# Patient Record
Sex: Male | Born: 1990 | Race: White | Hispanic: No | Marital: Married | Smoking: Never smoker
Health system: Southern US, Community
[De-identification: ages and names within clinical notes are randomized; demographics above are authoritative.]

## PROBLEM LIST (undated history)

## (undated) DIAGNOSIS — M549 Dorsalgia, unspecified: Secondary | ICD-10-CM

## (undated) DIAGNOSIS — K589 Irritable bowel syndrome without diarrhea: Secondary | ICD-10-CM

## (undated) DIAGNOSIS — F32A Depression, unspecified: Secondary | ICD-10-CM

## (undated) DIAGNOSIS — R12 Heartburn: Secondary | ICD-10-CM

## (undated) DIAGNOSIS — E7800 Pure hypercholesterolemia, unspecified: Secondary | ICD-10-CM

## (undated) DIAGNOSIS — R0602 Shortness of breath: Secondary | ICD-10-CM

## (undated) DIAGNOSIS — F329 Major depressive disorder, single episode, unspecified: Secondary | ICD-10-CM

## (undated) DIAGNOSIS — F419 Anxiety disorder, unspecified: Secondary | ICD-10-CM

## (undated) DIAGNOSIS — R079 Chest pain, unspecified: Secondary | ICD-10-CM

## (undated) HISTORY — DX: Shortness of breath: R06.02

## (undated) HISTORY — DX: Chest pain, unspecified: R07.9

## (undated) HISTORY — DX: Dorsalgia, unspecified: M54.9

## (undated) HISTORY — DX: Pure hypercholesterolemia, unspecified: E78.00

## (undated) HISTORY — DX: Heartburn: R12

## (undated) HISTORY — DX: Irritable bowel syndrome, unspecified: K58.9

---

## 2013-03-21 ENCOUNTER — Emergency Department (HOSPITAL_COMMUNITY)
Admission: EM | Admit: 2013-03-21 | Discharge: 2013-03-22 | Disposition: A | Discharge status: Home / Self Care | Payer: BC Managed Care – PPO | Attending: Emergency Medicine | Admitting: Emergency Medicine

## 2013-03-21 ENCOUNTER — Encounter (HOSPITAL_COMMUNITY): Payer: Self-pay

## 2013-03-21 ENCOUNTER — Emergency Department (HOSPITAL_COMMUNITY): Payer: BC Managed Care – PPO

## 2013-03-21 DIAGNOSIS — F419 Anxiety disorder, unspecified: Secondary | ICD-10-CM

## 2013-03-21 DIAGNOSIS — F410 Panic disorder [episodic paroxysmal anxiety] without agoraphobia: Secondary | ICD-10-CM | POA: Insufficient documentation

## 2013-03-21 DIAGNOSIS — R002 Palpitations: Secondary | ICD-10-CM | POA: Insufficient documentation

## 2013-03-21 DIAGNOSIS — F411 Generalized anxiety disorder: Secondary | ICD-10-CM | POA: Insufficient documentation

## 2013-03-21 LAB — URINALYSIS, ROUTINE W REFLEX MICROSCOPIC
Bilirubin Urine: NEGATIVE
Glucose, UA: NEGATIVE mg/dL
Ketones, ur: NEGATIVE mg/dL
Leukocytes, UA: NEGATIVE
Nitrite: NEGATIVE
Specific Gravity, Urine: 1.018 (ref 1.005–1.030)
pH: 6 (ref 5.0–8.0)

## 2013-03-21 LAB — CBC WITH DIFFERENTIAL/PLATELET
Eosinophils Absolute: 0 10*3/uL (ref 0.0–0.7)
Eosinophils Relative: 0 % (ref 0–5)
HCT: 43.8 % (ref 39.0–52.0)
Hemoglobin: 15.7 g/dL (ref 13.0–17.0)
Lymphocytes Relative: 19 % (ref 12–46)
Lymphs Abs: 1.6 10*3/uL (ref 0.7–4.0)
MCH: 28.9 pg (ref 26.0–34.0)
MCV: 80.5 fL (ref 78.0–100.0)
Monocytes Relative: 7 % (ref 3–12)
RBC: 5.44 MIL/uL (ref 4.22–5.81)
WBC: 8.4 10*3/uL (ref 4.0–10.5)

## 2013-03-21 LAB — BASIC METABOLIC PANEL
BUN: 10 mg/dL (ref 6–23)
CO2: 26 mEq/L (ref 19–32)
Chloride: 100 mEq/L (ref 96–112)
Creatinine, Ser: 0.85 mg/dL (ref 0.50–1.35)
Potassium: 3.4 mEq/L — ABNORMAL LOW (ref 3.5–5.1)

## 2013-03-21 MED ORDER — LORAZEPAM 1 MG PO TABS
1.0000 mg | ORAL_TABLET | Freq: Once | ORAL | Status: AC
  Administered 2013-03-21: 1 mg via ORAL
  Filled 2013-03-21: qty 1

## 2013-03-21 MED ORDER — LORAZEPAM 1 MG PO TABS: 1.0000 mg | ORAL_TABLET | Freq: Three times a day (TID) | ORAL | Status: AC | PRN

## 2013-03-21 NOTE — ED Provider Notes (Signed)
History     CSN: [id number]  Arrival date & time 03/21/13  1756   First MD Initiated Contact with Patient 03/21/13 2108      Chief Complaint  Patient presents with  . Abdominal Pain    (Consider location/radiation/quality/duration/timing/severity/associated sxs/prior treatment) HPI Comments: 22 year old male with no significant past medical history presents to the emergency department complaining of left-sided chest pain with associated palpitations lasting about an hour and a half earlier today. States he has a similar episode 6 days ago with left-sided chest pain associated palpitations lasting only 2 minutes, at the same time he felt lightheaded and dizzy. He reports feeling lightheaded and dizzy as well today. Pain described as a throbbing sensation, 6/10, radiating to his back, worse with deep inspiration causing him to become short of breath. Denies any new activity out of is normal. He is a nonsmoker, no recent long car or plane rides. He went to the health center at his school earlier today due to this pain and was told he was dehydrated, had vertigo and was given meclizine. States he drinks water throughout the day every day and sits at a desk all day and does not believe he is dehydrated. Meclizine is not making any difference for dizziness as this is not a constant feeling, only with the heart palpitations. Currently asymptomatic. Also states he has been urinating more frequently than normal and is concerned of diabetes as it runs in his family. Despite triage summary, patient denies any abdominal pain or epigastric discomfort and did not make any mention of food in triage.  Patient is a 22 y.o. male presenting with abdominal pain. The history is provided by the patient.  Abdominal Pain Associated symptoms include chest pain. Pertinent negatives include no abdominal pain, chills, fatigue, fever, headaches, nausea or vomiting.    History reviewed. No pertinent past medical  history.  History reviewed. No pertinent past surgical history.  No family history on file.  History  Substance Use Topics  . Smoking status: Not on file  . Smokeless tobacco: Not on file  . Alcohol Use: Not on file      Review of Systems  Constitutional: Negative for fever, chills and fatigue.  Eyes: Negative for visual disturbance.  Respiratory: Positive for shortness of breath. Negative for wheezing.   Cardiovascular: Positive for chest pain and palpitations. Negative for leg swelling.  Gastrointestinal: Negative for nausea, vomiting and abdominal pain.  Endocrine: Positive for polyuria.  Genitourinary: Negative for dysuria, urgency, enuresis and difficulty urinating.  Musculoskeletal: Positive for back pain.  Neurological: Positive for dizziness. Negative for headaches.  All other systems reviewed and are negative.    Allergies  Review of patient's allergies indicates no known allergies.  Home Medications   Current Outpatient Rx  Name  Route  Sig  Dispense  Refill  . ibuprofen (ADVIL,MOTRIN) 200 MG tablet   Oral   Take 200 mg by mouth every 6 (six) hours as needed for pain.           BP 131/88  Pulse 105  Temp(Src) 98 F (36.7 C) (Oral)  SpO2 99%  Physical Exam  Nursing note and vitals reviewed. Constitutional: He is oriented to person, place, and time. He appears well-developed and well-nourished. No distress.  HENT:  Head: Normocephalic and atraumatic.  Mouth/Throat: Oropharynx is clear and moist.  Eyes: Conjunctivae and EOM are normal. Pupils are equal, round, and reactive to light.  Neck: Normal range of motion. Neck supple.  Cardiovascular: Regular rhythm,  normal heart sounds and intact distal pulses.  Tachycardia present.   Pulmonary/Chest: Effort normal and breath sounds normal. No respiratory distress. He has no wheezes. He has no rales. He exhibits no tenderness.  Abdominal: Soft. Bowel sounds are normal. There is no tenderness.   Musculoskeletal: Normal range of motion. He exhibits no edema.  Neurological: He is alert and oriented to person, place, and time. No cranial nerve deficit. Coordination normal.  Skin: Skin is warm and dry. He is not diaphoretic.  Psychiatric: His behavior is normal. His mood appears anxious.    ED Course  Procedures (including critical care time)  Labs Reviewed  GLUCOSE, CAPILLARY - Abnormal; Notable for the following:    Glucose-Capillary 128 (*)    All other components within normal limits  BASIC METABOLIC PANEL - Abnormal; Notable for the following:    Potassium 3.4 (*)    Glucose, Bld 128 (*)    All other components within normal limits  CBC WITH DIFFERENTIAL  D-DIMER, QUANTITATIVE  URINALYSIS, ROUTINE W REFLEX MICROSCOPIC   Dg Chest 2 View  03/21/2013   *RADIOLOGY REPORT*  Clinical Data:  Chest pain and syncope.  CHEST - 2 VIEW  Comparison: None  Findings: The heart size and mediastinal contours are within normal limits.  Both lungs are clear.  The visualized skeletal structures are unremarkable.  IMPRESSION: No active disease.   Original Report Authenticated By: Irish Lack, M.D.     1. Anxiety   2. Panic attack       MDM  22 y/o male with chest pain and palpitations. Mild tachycardia on initial exam. Chest pain pleuritic, obtained d-dimer to r/o PE which was normal. After discussing results with patient, he admits to being under increased stress and has been very anxious with work. He also asked me to speak with his mom on the phone which I did who states he has bad anxiety and overworks himself. Patient states he used to see a therapist. I feel his palpitations and chest pain are from anxiety. Prescription for ativan given. Tachycardia subsided on repeat examination after ativan given in ED. Advised to see therapist, resource guide given for PCP follow up. Glucose 128.        Trevor Mace, PA-C 03/21/13 2327

## 2013-03-21 NOTE — ED Notes (Signed)
Pt states that he had been seen by his md office at by his school and was having abd pain worse after food and lightheaded, was told that he was dehydrated and had vertigo and was given meclazine. Has had pain to epigastric area.

## 2013-03-23 NOTE — ED Provider Notes (Signed)
Medical screening examination/treatment/procedure(s) were performed by non-physician practitioner and as supervising physician I was immediately available for consultation/collaboration.   Richardean Canal, MD 03/23/13 [phone]

## 2015-03-08 ENCOUNTER — Encounter
Admission: EM | Disposition: A | Discharge status: Home / Self Care | Payer: Self-pay | Source: Home / Self Care | Attending: Emergency Medicine

## 2015-03-08 ENCOUNTER — Observation Stay
Admission: EM | Admit: 2015-03-08 | Discharge: 2015-03-10 | Disposition: A | Discharge status: Home / Self Care | Payer: BLUE CROSS/BLUE SHIELD | Attending: Surgery | Admitting: Surgery

## 2015-03-08 ENCOUNTER — Emergency Department: Payer: BLUE CROSS/BLUE SHIELD

## 2015-03-08 ENCOUNTER — Observation Stay: Payer: BLUE CROSS/BLUE SHIELD | Admitting: Anesthesiology

## 2015-03-08 ENCOUNTER — Encounter: Payer: Self-pay | Admitting: Emergency Medicine

## 2015-03-08 DIAGNOSIS — K3580 Unspecified acute appendicitis: Principal | ICD-10-CM | POA: Diagnosis present

## 2015-03-08 DIAGNOSIS — F419 Anxiety disorder, unspecified: Secondary | ICD-10-CM | POA: Diagnosis not present

## 2015-03-08 DIAGNOSIS — K353 Acute appendicitis with localized peritonitis, without perforation or gangrene: Secondary | ICD-10-CM

## 2015-03-08 DIAGNOSIS — F329 Major depressive disorder, single episode, unspecified: Secondary | ICD-10-CM | POA: Insufficient documentation

## 2015-03-08 HISTORY — DX: Anxiety disorder, unspecified: F41.9

## 2015-03-08 HISTORY — DX: Major depressive disorder, single episode, unspecified: F32.9

## 2015-03-08 HISTORY — DX: Depression, unspecified: F32.A

## 2015-03-08 HISTORY — PX: LAPAROSCOPIC APPENDECTOMY: SHX408

## 2015-03-08 LAB — CBC WITH DIFFERENTIAL/PLATELET
Basophils Absolute: 0.1 10*3/uL (ref 0–0.1)
Basophils Relative: 0 %
EOS PCT: 0 %
Eosinophils Absolute: 0 10*3/uL (ref 0–0.7)
HCT: 46 % (ref 40.0–52.0)
Hemoglobin: 15.3 g/dL (ref 13.0–18.0)
LYMPHS ABS: 2.4 10*3/uL (ref 1.0–3.6)
LYMPHS PCT: 16 %
MCH: 27.9 pg (ref 26.0–34.0)
MCHC: 33.3 g/dL (ref 32.0–36.0)
MCV: 84 fL (ref 80.0–100.0)
MONOS PCT: 7 %
Monocytes Absolute: 1 10*3/uL (ref 0.2–1.0)
NEUTROS PCT: 77 %
Neutro Abs: 11.5 10*3/uL — ABNORMAL HIGH (ref 1.4–6.5)
PLATELETS: 241 10*3/uL (ref 150–440)
RBC: 5.47 MIL/uL (ref 4.40–5.90)
RDW: 12.5 % (ref 11.5–14.5)
WBC: 15 10*3/uL — ABNORMAL HIGH (ref 3.8–10.6)

## 2015-03-08 LAB — COMPREHENSIVE METABOLIC PANEL
ALBUMIN: 5 g/dL (ref 3.5–5.0)
ALT: 22 U/L (ref 17–63)
ANION GAP: 7 (ref 5–15)
AST: 22 U/L (ref 15–41)
Alkaline Phosphatase: 58 U/L (ref 38–126)
BILIRUBIN TOTAL: 1 mg/dL (ref 0.3–1.2)
BUN: 16 mg/dL (ref 6–20)
CHLORIDE: 102 mmol/L (ref 101–111)
CO2: 32 mmol/L (ref 22–32)
Calcium: 10.1 mg/dL (ref 8.9–10.3)
Creatinine, Ser: 0.96 mg/dL (ref 0.61–1.24)
GFR calc Af Amer: >60 mL/min (ref >60)
GFR calc non Af Amer: >60 mL/min (ref >60)
Glucose, Bld: 104 mg/dL — ABNORMAL HIGH (ref 65–99)
POTASSIUM: 4 mmol/L (ref 3.5–5.1)
SODIUM: 141 mmol/L (ref 135–145)
TOTAL PROTEIN: 8.4 g/dL — AB (ref 6.5–8.1)

## 2015-03-08 LAB — LIPASE, BLOOD: Lipase: 37 U/L (ref 22–51)

## 2015-03-08 LAB — URINALYSIS COMPLETE WITH MICROSCOPIC (ARMC ONLY)
BACTERIA UA: NONE SEEN
BILIRUBIN URINE: NEGATIVE
GLUCOSE, UA: NEGATIVE mg/dL
Hgb urine dipstick: NEGATIVE
Ketones, ur: NEGATIVE mg/dL
Leukocytes, UA: NEGATIVE
Nitrite: NEGATIVE
PH: 6 (ref 5.0–8.0)
Protein, ur: NEGATIVE mg/dL
Specific Gravity, Urine: 1.018 (ref 1.005–1.030)
Squamous Epithelial / LPF: NONE SEEN

## 2015-03-08 SURGERY — APPENDECTOMY, LAPAROSCOPIC
Anesthesia: General | Site: Abdomen | Wound class: Clean Contaminated

## 2015-03-08 MED ORDER — HYDROMORPHONE HCL 1 MG/ML IJ SOLN: 0.2500 mg | INTRAMUSCULAR | Status: DC | PRN

## 2015-03-08 MED ORDER — IOHEXOL 300 MG/ML  SOLN
100.0000 mL | Freq: Once | INTRAMUSCULAR | Status: AC | PRN
  Administered 2015-03-08: 100 mL via INTRAVENOUS

## 2015-03-08 MED ORDER — MIDAZOLAM HCL 2 MG/2ML IJ SOLN
INTRAMUSCULAR | Status: DC | PRN
  Administered 2015-03-08: 2 mg via INTRAVENOUS

## 2015-03-08 MED ORDER — MORPHINE SULFATE 4 MG/ML IJ SOLN
INTRAMUSCULAR | Status: AC
  Administered 2015-03-08: 4 mg via INTRAVENOUS
  Filled 2015-03-08: qty 1

## 2015-03-08 MED ORDER — ENOXAPARIN SODIUM 40 MG/0.4ML ~~LOC~~ SOLN
40.0000 mg | SUBCUTANEOUS | Status: DC
  Administered 2015-03-09 – 2015-03-10 (×2): 40 mg via SUBCUTANEOUS
  Filled 2015-03-08 (×2): qty 0.4

## 2015-03-08 MED ORDER — SUCCINYLCHOLINE CHLORIDE 20 MG/ML IJ SOLN
INTRAMUSCULAR | Status: DC | PRN
  Administered 2015-03-08: 100 mg via INTRAVENOUS

## 2015-03-08 MED ORDER — ROCURONIUM BROMIDE 100 MG/10ML IV SOLN
INTRAVENOUS | Status: DC | PRN
  Administered 2015-03-08: 40 mg via INTRAVENOUS

## 2015-03-08 MED ORDER — ONDANSETRON HCL 4 MG/2ML IJ SOLN: 4.0000 mg | Freq: Once | INTRAMUSCULAR | Status: DC | PRN

## 2015-03-08 MED ORDER — KCL IN DEXTROSE-NACL 20-5-0.45 MEQ/L-%-% IV SOLN
INTRAVENOUS | Status: DC
  Administered 2015-03-09 – 2015-03-10 (×4): via INTRAVENOUS
  Filled 2015-03-08 (×7): qty 1000

## 2015-03-08 MED ORDER — MORPHINE SULFATE 2 MG/ML IJ SOLN
2.0000 mg | INTRAMUSCULAR | Status: DC | PRN
  Administered 2015-03-09: 2 mg via INTRAVENOUS
  Administered 2015-03-09 (×2): 4 mg via INTRAVENOUS
  Filled 2015-03-08 (×2): qty 2
  Filled 2015-03-08: qty 1

## 2015-03-08 MED ORDER — FENTANYL CITRATE (PF) 100 MCG/2ML IJ SOLN
25.0000 ug | INTRAMUSCULAR | Status: AC | PRN
  Administered 2015-03-08 (×6): 25 ug via INTRAVENOUS

## 2015-03-08 MED ORDER — ONDANSETRON HCL 4 MG/2ML IJ SOLN: 4.0000 mg | Freq: Once | INTRAMUSCULAR | Status: AC

## 2015-03-08 MED ORDER — KETOROLAC TROMETHAMINE 30 MG/ML IJ SOLN
INTRAMUSCULAR | Status: DC | PRN
  Administered 2015-03-08: 30 mg via INTRAVENOUS

## 2015-03-08 MED ORDER — SUGAMMADEX SODIUM 200 MG/2ML IV SOLN
INTRAVENOUS | Status: DC | PRN
  Administered 2015-03-08: 250 mg via INTRAVENOUS

## 2015-03-08 MED ORDER — HYDROCODONE-ACETAMINOPHEN 5-325 MG PO TABS
1.0000 | ORAL_TABLET | ORAL | Status: DC | PRN
  Administered 2015-03-09 (×3): 2 via ORAL
  Administered 2015-03-10: 1 via ORAL
  Administered 2015-03-10: 2 via ORAL
  Administered 2015-03-10: 1 via ORAL
  Filled 2015-03-08: qty 1
  Filled 2015-03-08 (×4): qty 2
  Filled 2015-03-08: qty 1

## 2015-03-08 MED ORDER — ONDANSETRON HCL 4 MG/2ML IJ SOLN
INTRAMUSCULAR | Status: AC
  Administered 2015-03-08: 4 mg via INTRAVENOUS
  Filled 2015-03-08: qty 2

## 2015-03-08 MED ORDER — IOHEXOL 240 MG/ML SOLN: 25.0000 mL | Freq: Once | INTRAMUSCULAR | Status: DC | PRN

## 2015-03-08 MED ORDER — SODIUM CHLORIDE 0.9 % IR SOLN
Status: DC | PRN
  Administered 2015-03-08: 100 mL

## 2015-03-08 MED ORDER — SODIUM CHLORIDE 0.9 % IV SOLN
1.5000 g | Freq: Four times a day (QID) | INTRAVENOUS | Status: AC
  Administered 2015-03-09 (×2): 1.5 g via INTRAVENOUS
  Filled 2015-03-08 (×3): qty 1.5

## 2015-03-08 MED ORDER — FENTANYL CITRATE (PF) 100 MCG/2ML IJ SOLN
INTRAMUSCULAR | Status: AC
  Filled 2015-03-08: qty 2

## 2015-03-08 MED ORDER — MORPHINE SULFATE 4 MG/ML IJ SOLN: 4.0000 mg | Freq: Once | INTRAMUSCULAR | Status: AC

## 2015-03-08 MED ORDER — LACTATED RINGERS IV SOLN
INTRAVENOUS | Status: DC | PRN
  Administered 2015-03-08: 21:00:00 via INTRAVENOUS

## 2015-03-08 MED ORDER — PROPOFOL 10 MG/ML IV BOLUS
INTRAVENOUS | Status: DC | PRN
  Administered 2015-03-08: 180 mg via INTRAVENOUS

## 2015-03-08 MED ORDER — SERTRALINE HCL 50 MG PO TABS
100.0000 mg | ORAL_TABLET | Freq: Every evening | ORAL | Status: DC
  Administered 2015-03-09: 100 mg via ORAL
  Filled 2015-03-08 (×2): qty 2

## 2015-03-08 MED ORDER — ONDANSETRON HCL 4 MG/2ML IJ SOLN
INTRAMUSCULAR | Status: DC | PRN
  Administered 2015-03-08: 4 mg via INTRAVENOUS

## 2015-03-08 MED ORDER — CEFOXITIN SODIUM-DEXTROSE 1-4 GM-% IV SOLR (PREMIX)
1.0000 g | Freq: Four times a day (QID) | INTRAVENOUS | Status: DC
  Administered 2015-03-08: 1 g via INTRAVENOUS
  Filled 2015-03-08 (×3): qty 50

## 2015-03-08 MED ORDER — FENTANYL CITRATE (PF) 100 MCG/2ML IJ SOLN
INTRAMUSCULAR | Status: DC | PRN
  Administered 2015-03-08: 100 ug via INTRAVENOUS
  Administered 2015-03-08 (×2): 50 ug via INTRAVENOUS

## 2015-03-08 SURGICAL SUPPLY — 33 items
APPLIER CLIP ROT 10 11.4 M/L (STAPLE) ×2
CANISTER SUCT 1200ML W/VALVE (MISCELLANEOUS) ×2 IMPLANT
CATH TRAY 16F METER LATEX (MISCELLANEOUS) ×2 IMPLANT
CHLORAPREP W/TINT 26ML (MISCELLANEOUS) ×2 IMPLANT
CLIP APPLIE ROT 10 11.4 M/L (STAPLE) ×1 IMPLANT
CUTTER FLEX LINEAR 45M (STAPLE) ×2 IMPLANT
ELECT CAUTERY BLADE TIP 2.5 (TIP) ×2
ELECTRODE CAUTERY BLDE TIP 2.5 (TIP) ×1 IMPLANT
ENDOPOUCH RETRIEVER 10 (MISCELLANEOUS) ×2 IMPLANT
GLOVE BIO SURGEON STRL SZ7.5 (GLOVE) ×2 IMPLANT
GOWN STRL REUS W/ TWL LRG LVL3 (GOWN DISPOSABLE) ×2 IMPLANT
GOWN STRL REUS W/TWL LRG LVL3 (GOWN DISPOSABLE) ×2
IRRIGATION STRYKERFLOW (MISCELLANEOUS) ×1 IMPLANT
IRRIGATOR STRYKERFLOW (MISCELLANEOUS) ×2
IV NS 1000ML (IV SOLUTION) ×1
IV NS 1000ML BAXH (IV SOLUTION) ×1 IMPLANT
KIT RM TURNOVER STRD PROC AR (KITS) ×2 IMPLANT
NS IRRIG 500ML POUR BTL (IV SOLUTION) ×2 IMPLANT
PACK LAP CHOLECYSTECTOMY (MISCELLANEOUS) ×2 IMPLANT
PAD GROUND ADULT SPLIT (MISCELLANEOUS) ×2 IMPLANT
PENCIL ELECTRO HAND CTR (MISCELLANEOUS) ×2 IMPLANT
RELOAD STAPLE TA45 3.5 REG BLU (ENDOMECHANICALS) ×4 IMPLANT
SCISSORS METZENBAUM CVD 33 (INSTRUMENTS) IMPLANT
SHEARS HARMONIC ACE PLUS 36CM (ENDOMECHANICALS) ×2 IMPLANT
SLEEVE ENDOPATH XCEL 5M (ENDOMECHANICALS) ×2 IMPLANT
STRIP SUTURE WOUND CLOSURE 1/2 (SUTURE) ×2 IMPLANT
SUT MON AB 5-0 P3 18 (SUTURE) ×2 IMPLANT
SUT PDS PLUS 0 (SUTURE) ×1
SUT PDS PLUS AB 0 CT-2 (SUTURE) ×1 IMPLANT
SUT VICRYL 0 AB UR-6 (SUTURE) ×4 IMPLANT
TROCAR XCEL BLUNT TIP 100MML (ENDOMECHANICALS) ×2 IMPLANT
TROCAR XCEL NON-BLD 5MMX100MML (ENDOMECHANICALS) ×2 IMPLANT
TUBING INSUFFLATOR HI FLOW (MISCELLANEOUS) ×2 IMPLANT

## 2015-03-08 NOTE — Transfer of Care (Signed)
Immediate Anesthesia Transfer of Care Note  Patient: Dustin CoveChristopher Montgomery  Procedure(s) Performed: Procedure(s): APPENDECTOMY LAPAROSCOPIC (N/A)  Patient Location: PACU  Anesthesia Type:General  Level of Consciousness: sedated and responds to stimulation  Airway & Oxygen Therapy: Patient Spontanous Breathing and Patient connected to face mask oxygen  Post-op Assessment: Report given to RN and Post -op Vital signs reviewed and stable  Post vital signs: Reviewed and stable  Last Vitals:  Filed Vitals:   03/08/15 2237  BP: 133/78  Pulse: 58  Temp: 37.2 C  Resp: 16    Complications: No apparent anesthesia complications

## 2015-03-08 NOTE — ED Notes (Signed)
Patient to ED with report of abdominal pain that started 2-3 days ago. Patient reports pain started above umbilicus and now has moved down to right lower abdomen. [facility]

## 2015-03-08 NOTE — ED Provider Notes (Signed)
[facility] Emergency Department Provider Note  Time seen: 4:39 PM  I have reviewed the triage vital signs and the nursing notes.   HISTORY  Chief Complaint Abdominal Pain    HPI Shante Maysonet is a 24 y.o. male with no past medical history who presents to the emergency department with right lower quadrant abdominal pain. According to the patient he developed pain in his mid abdomen yesterday, is now migrated down to the right lower quadrant. Denies nausea, vomiting, diarrhea, dysuria. Denies history of kidney stones. Denies fever. Patient ranks the pain as a 7/10 dull/aching at its most severe, currently a 4/10. Has not noted any modifying factors. Last ate/drink at 2 PM.    Past Medical History  Diagnosis Date  . Anxiety   . Depression     There are no active problems to display for this patient.   History reviewed. No pertinent past surgical history.  Current Outpatient Rx  Name  Route  Sig  Dispense  Refill  . ibuprofen (ADVIL,MOTRIN) 200 MG tablet   Oral   Take 200 mg by mouth every 6 (six) hours as needed for pain.         Marland Kitchen LORazepam (ATIVAN) 1 MG tablet   Oral   Take 1 tablet (1 mg total) by mouth 3 (three) times daily as needed for anxiety.   15 tablet   0     Allergies Review of patient's allergies indicates no known allergies.  History reviewed. No pertinent family history.  Social History History  Substance Use Topics  . Smoking status: Never Smoker   . Smokeless tobacco: Not on file  . Alcohol Use: Yes    Review of Systems Constitutional: Negative for fever. Cardiovascular: Negative for chest pain. Respiratory: Negative for shortness of breath. Gastrointestinal: Positive for right lower quadrant abdominal pain. Negative for nausea/vomiting/diarrhea. Genitourinary: Negative for dysuria. Musculoskeletal: Negative for back pain. Neurological: Negative for headache 10-point ROS otherwise  negative.  ____________________________________________   PHYSICAL EXAM:  VITAL SIGNS: ED Triage Vitals  Enc Vitals Group     BP 03/08/15 1531 137/77 mmHg     Pulse Rate 03/08/15 1531 76     Resp 03/08/15 1531 18     Temp 03/08/15 1531 98.4 F (36.9 C)     Temp Source 03/08/15 1531 Oral     SpO2 03/08/15 1531 100 %     Weight 03/08/15 1531 211 lb 10.3 oz (96 kg)     Height 03/08/15 1531  (1.753 m)     Head Cir --      Peak Flow --      Pain Score 03/08/15 1526 7     Pain Loc --      Pain Edu? --      Excl. in GC? --     Constitutional: Alert and oriented. Well appearing and in no distress. ENT   Mouth/Throat: Mucous membranes are moist. Cardiovascular: Normal rate, regular rhythm. No murmur Respiratory: Normal respiratory effort without tachypnea nor retractions. Breath sounds are clear Gastrointestinal: Soft, moderate right lower quadrant abdominal tenderness palpation. No rebound or guarding. No CVA tenderness to palpation. Musculoskeletal: Nontender with normal range of motion in all extremities Neurologic:  Normal speech and language. No gross focal neurologic deficits Skin:  Skin is warm, dry and intact.  Psychiatric: Mood and affect are normal. Speech and behavior are normal.   ____________________________________________    RADIOLOGY    CT consistent with appendicitis  ____________________________________________  INITIAL IMPRESSION / ASSESSMENT AND PLAN / ED COURSE  Pertinent labs & imaging results that were available during my care of the patient were reviewed by me and considered in my medical decision making (see chart for details).  Patient with 2 days of right lower quadrant abdominal pain, with moderate tenderness palpation. Initially started in the mid abdomen and migrated down to the right lower quadrant, likely appendicitis. White blood cell count of 15. We will check a CT to confirm.   CT consistent with appendicitis discussed with  Dr. Egbert GaribaldiBird will see the patient. ____________________________________________   FINAL CLINICAL IMPRESSION(S) / ED DIAGNOSES Appendicitis Right lower quadrant abdominal pain   Minna AntisKevin Iszabella Hebenstreit, MD 03/08/15 [id number]

## 2015-03-08 NOTE — ED Notes (Signed)
Surgeon spoke with pt - pt will be going to surgery later this afternoon. Pt last ate around 1230p

## 2015-03-08 NOTE — Anesthesia Preprocedure Evaluation (Signed)
Anesthesia Evaluation  Patient identified by MRN, date of birth, ID band Patient awake    Reviewed: Allergy & Precautions, H&P , Patient's Chart, lab work & pertinent test results  Airway Mallampati: I       Dental  (+) Teeth Intact   Pulmonary    Pulmonary exam normal       Cardiovascular Normal cardiovascular examRhythm:regular     Neuro/Psych PSYCHIATRIC DISORDERS    GI/Hepatic   Endo/Other    Renal/GU      Musculoskeletal   Abdominal   Peds  Hematology   Anesthesia Other Findings   Reproductive/Obstetrics                             Anesthesia Physical Anesthesia Plan  ASA: II and emergent  Anesthesia Plan: General ETT   Post-op Pain Management:    Induction:   Airway Management Planned:   Additional Equipment:   Intra-op Plan:   Post-operative Plan:   Informed Consent:   Plan Discussed with: CRNA  Anesthesia Plan Comments:         Anesthesia Quick Evaluation

## 2015-03-08 NOTE — Op Note (Signed)
Laparoscopic Appendectomy Operative Note  Preoperative Diagnosis: Acute Appendicitis  Postoperative Diagnosis: Same  Operation Performed: Laparoscopic Appendectomy  Surgeon: Laverle Patter., M.D.  Anesthesia: General Endotracheal  Date of Procedure: 03/08/2015   Procedure in Detail:  The risks (including the possibility of adjacent organ injury, the necessity of converting to an open procedure, and the risk of postoperative infection / abscess), potential benefits, non-surgical treatment options, and expected outcomes were reviewed with the patient. The patient concurred with the proposed plan and agreed to proceed, giving informed consent.   Prior to the induction of anesthesia, antibiotic prophylaxis was administered. The patient was placed supine on the OR table and prepped and draped in the usual sterile fashion.   A 341m Hg CO2 pneumoperitoneum was created via a Hasson cannula in the supraumbilical midline position within horizontal mattress sutures of 0 Vicryl. Two additional 571mtrocars were placed under direct visualization. A 41m43m0 degree angled laparoscope was used. The appendix was identified, grasped, and the mesoappendix was divided with the Harmonic scalpel down to its base, where it met the cecum. The appendectomy was performed with an Endo-GIA stapling device and the appendix was placed into an Endocatch bag and removed from the abdomen via the epigastric port. The RLQ, paracolic gutter, and pelvis were irrigated with copious amounts of warm normal saline and suctioned clear. Hemostasis was excellent. The patient's pneumoperitoneum was desufflated and the abdominal wall decannulated. The midline linea alba was closed with an interrupted 0 PDS and the previously placed horizontal mattress sutures of 0 Vicryl were tied to each other. All three skin sites were closed with subcuticular 5-0 Monocryl and Suture Strips.   Findings: Early appendicitis with injection and minimal  exudate.   Estimated Blood Loss: Minimal         Specimens: Appendix           Complications: None; the patient tolerated the procedure well.

## 2015-03-08 NOTE — Anesthesia Procedure Notes (Signed)
Procedure Name: Intubation Date/Time: 03/08/2015 9:39 PM Performed by: Omer JackWEATHERLY, Dustin Vandergrift Pre-anesthesia Checklist: Patient identified, Emergency Drugs available, Suction available, Patient being monitored and Timeout performed Patient Re-evaluated:Patient Re-evaluated prior to inductionOxygen Delivery Method: Circle system utilized Preoxygenation: Pre-oxygenation with 100% oxygen Intubation Type: IV induction and Rapid sequence Ventilation: Mask ventilation without difficulty Laryngoscope Size: Mac and 3 Grade View: Grade I Tube type: Oral Tube size: 7.5 mm Number of attempts: 1 Placement Confirmation: ETT inserted through vocal cords under direct vision,  positive ETCO2 and breath sounds checked- equal and bilateral Secured at: 21 cm Tube secured with: Tape Dental Injury: Teeth and Oropharynx as per pre-operative assessment

## 2015-03-08 NOTE — H&P (Signed)
[facility]   PATIENT NAME: Dustin Montgomery    MR#:  [id number]  DATE OF BIRTH:  04-Sep-1991   Chief Complaint abdominal pain  SUBJECTIVE:  This is a 24 year-old otherwise healthy male presents to the emergency room with approximately 36 hour history of abdominal pain which began in his periumbilical region and has migrated to the right lower quadrant. He had some mild nausea but no emesis. No fever and no sick contacts. He describes the pain as constant and stabbing in the right lower quadrant now. Pain migrated to the right lower quadrant 12 hours ago.  He denies having pain like this in the past.  There is been no hematuria dysuria and urinary frequency, jaundice or history of diverticulitis in the past. The patient is had no previous abdominal operations. Prior to my arrival he has not received any pain medication. Workup in the emergency room was suggestive of acute appendicitis seen on CT scan.   REVIEW OF SYSTEMS:   Review of Systems  Constitutional: Positive for malaise/fatigue. Negative for fever, chills, weight loss and diaphoresis.  HENT: Negative.   Eyes: Negative.   Respiratory: Negative.  Negative for cough and hemoptysis.   Cardiovascular: Positive for chest pain and palpitations.  Gastrointestinal: Positive for nausea and abdominal pain. Negative for vomiting, diarrhea and constipation.  Genitourinary: Negative for dysuria, urgency, frequency, hematuria and flank pain.  Musculoskeletal: Negative.   Skin: Negative for itching and rash.  Neurological: Negative.  Negative for weakness and headaches.  Endo/Heme/Allergies: Negative.   Psychiatric/Behavioral: Positive for depression. The patient is nervous/anxious.   All other systems reviewed and are negative.  Social history the patient does not drink on a daily basis does not smoke recently graduated college unemployed.  Past medical history is significant for depression and anxiety  Past surgical  history is negative.  Family history is negative for hypertension and cancer.   DRUG ALLERGIES:  No Known Allergies  VITALS:  Blood pressure 142/64, pulse 75, temperature 98.4 F (36.9 C), temperature source Oral, resp. rate 18, height  (1.753 m), weight 96 kg (211 lb 10.3 oz), SpO2 100 %.  PHYSICAL EXAMINATION:  GENERAL:  24 y.o.-year-old patient lying in the bed with no acute distress.  EYES: Pupils equal, round, reactive to light and accommodation. No scleral icterus. Extraocular muscles intact.  HEENT: Head atraumatic, normocephalic. Oropharynx and nasopharynx clear.  NECK:  Supple, no jugular venous distention. No thyroid enlargement, no tenderness.  LUNGS: Normal breath sounds bilaterally, no wheezing, rales,rhonchi or crepitation. No use of accessory muscles of respiration.  CARDIOVASCULAR: S1, S2 normal. No murmurs, rubs, or gallops.  ABDOMEN: Soft, nontender, nondistended. Bowel sounds present. No organomegaly or mass. There is mild tenderness to deep palpation in the right lower quadrant. There is a negative Rovsing sign. EXTREMITIES: No pedal edema, cyanosis, or clubbing.  NEUROLOGIC: Cranial nerves II through XII are intact. Muscle strength 5/5 in all extremities. Sensation intact. Gait not checked.  PSYCHIATRIC: The patient is alert and oriented x 3.  SKIN: No obvious rash, lesion, or ulcer.    CBC Latest Ref Rng 03/08/2015 03/21/2013  WBC 3.8 - 10.6 K/uL 15.0(H) 8.4  Hemoglobin 13.0 - 18.0 g/dL 40.9 81.1  Hematocrit 91.4 - 52.0 % 46.0 43.8  Platelets 150 - 440 K/uL 241 269    Electrolytes are unremarkable. Urinalysis is negative. Her function tests are normal, lipase is 37.  I personally reviewed the CT scan. There is findings consistent with early acute nonperforated appendicitis.  ASSESSMENT AND PLAN:   24 year old white male with early acute appendicitis.  The patient will undergo laparoscopic appendectomy later tonight. This will be performed by my  associate Dr. Anda KraftMarterre. We will go ahead and start his intravenous antibiotics.

## 2015-03-09 ENCOUNTER — Encounter: Payer: Self-pay | Admitting: *Deleted

## 2015-03-09 LAB — CBC
HEMATOCRIT: 42.2 % (ref 40.0–52.0)
Hemoglobin: 14.3 g/dL (ref 13.0–18.0)
MCH: 28.7 pg (ref 26.0–34.0)
MCHC: 33.9 g/dL (ref 32.0–36.0)
MCV: 84.8 fL (ref 80.0–100.0)
Platelets: 208 10*3/uL (ref 150–440)
RBC: 4.97 MIL/uL (ref 4.40–5.90)
RDW: 12.5 % (ref 11.5–14.5)
WBC: 8.7 10*3/uL (ref 3.8–10.6)

## 2015-03-09 LAB — CREATININE, SERUM: CREATININE: 0.97 mg/dL (ref 0.61–1.24)

## 2015-03-09 NOTE — Anesthesia Postprocedure Evaluation (Signed)
  Anesthesia Post-op Note  Patient: Dustin CoveChristopher Montgomery  Procedure(s) Performed: Procedure(s): APPENDECTOMY LAPAROSCOPIC (N/A)  Anesthesia type:General ETT  Patient location: PACU  Post pain: Pain level controlled  Post assessment: Post-op Vital signs reviewed, Patient's Cardiovascular Status Stable, Respiratory Function Stable, Patent Airway and No signs of Nausea or vomiting  Post vital signs: Reviewed and stable  Last Vitals:  Filed Vitals:   03/09/15 0802  BP: 131/62  Pulse: 57  Temp: 36.8 C  Resp: 18    Level of consciousness: awake, alert  and patient cooperative  Complications: No apparent anesthesia complications

## 2015-03-09 NOTE — Progress Notes (Signed)
1 Day Post-Op   Subjective:    Vital signs in last 24 hours: Temp:  [97.8 F (36.6 C)-99.6 F (37.6 C)] 98.1 F (36.7 C) (05/30 1314) Pulse Rate:  [49-86] 71 (05/30 1314) Resp:  [16-20] 16 (05/30 1314) BP: (113-142)/(43-78) 134/54 mmHg (05/30 1314) SpO2:  [95 %-100 %] 99 % (05/30 1314) Weight:  [94.666 kg (208 lb 11.2 oz)] 94.666 kg (208 lb 11.2 oz) (05/29 2018) Last BM Date: 03/06/15  Intake/Output from previous day: 05/29 0701 - 05/30 0700 In: 800 [I.V.:800] Out: 175 [Urine:150; Blood:25]  GI: normal findings: Wounds look good with no evidence for infection  Lab Results:  CBC  Recent Labs  03/08/15 1532 03/09/15 0509  WBC 15.0* 8.7  HGB 15.3 14.3  HCT 46.0 42.2  PLT 241 208   CMP     Component Value Date/Time   NA 141 03/08/2015 1532   K 4.0 03/08/2015 1532   CL 102 03/08/2015 1532   CO2 32 03/08/2015 1532   GLUCOSE 104* 03/08/2015 1532   BUN 16 03/08/2015 1532   CREATININE 0.97 03/09/2015 0509   CALCIUM 10.1 03/08/2015 1532   PROT 8.4* 03/08/2015 1532   ALBUMIN 5.0 03/08/2015 1532   AST 22 03/08/2015 1532   ALT 22 03/08/2015 1532   ALKPHOS 58 03/08/2015 1532   BILITOT 1.0 03/08/2015 1532   GFRNONAA >60 03/09/2015 0509   GFRAA >60 03/09/2015 0509   PT/INR No results for input(s): LABPROT, INR in the last 72 hours.  Studies/Results: Ct Abdomen Pelvis W Contrast  03/08/2015   CLINICAL DATA:  Right lower quadrant pain for 2 days, elevated white blood cell count  EXAM: CT ABDOMEN AND PELVIS WITH CONTRAST  TECHNIQUE: Multidetector CT imaging of the abdomen and pelvis was performed using the standard protocol following bolus administration of intravenous contrast.  CONTRAST:  100mL OMNIPAQUE IOHEXOL 300 MG/ML  SOLN  COMPARISON:  None.  FINDINGS: Lung bases are free of acute infiltrate or sizable effusion.  The liver, gallbladder, spleen, adrenal glands and pancreas are within normal limits. The kidneys are well visualized bilateral and reveal no renal calculi.  Renal cystic changes noted in the upper pole on the right. The bladder is well distended. No pelvic mass lesion is seen. The appendix is mildly prominent with mild periappendiceal inflammatory change. Maximum diameter is approximately 11 mm. This is consistent with early appendicitis. No other focal abnormality is seen.  IMPRESSION: Changes consistent with early appendicitis.   Electronically Signed   By: Alcide CleverMark  Lukens M.D.   On: 03/08/2015 18:00    Assessment/Plan: Overall he is doing well post surgery. We will increase his activity. He is still having some pain control issues so we will not recommend discharge this evening. This plan was discussed with him in detail.

## 2015-03-10 ENCOUNTER — Encounter: Payer: Self-pay | Admitting: Surgery

## 2015-03-10 MED ORDER — HYDROCODONE-ACETAMINOPHEN 5-325 MG PO TABS: 1.0000 | ORAL_TABLET | ORAL | Status: DC | PRN

## 2015-03-10 NOTE — Progress Notes (Signed)
2 Days Post-Op   Subjective:  He is complaining of more incisional pain today and a sore throat which has increased since yesterday. He denies any nausea or vomiting. Overall he does not feel as good as he did immediately following surgery. Most of his pain is incisional pain  Vital signs in last 24 hours: Temp:  [98 F (36.7 C)-98.3 F (36.8 C)] 98.2 F (36.8 C) (05/31 0443) Pulse Rate:  [57-76] 57 (05/31 0443) Resp:  [16-20] 19 (05/31 0443) BP: (127-136)/(54-68) 130/62 mmHg (05/31 0443) SpO2:  [97 %-100 %] 100 % (05/31 0443) Last BM Date: 03/06/15  Intake/Output from previous day: 05/30 0701 - 05/31 0700 In: 3780 [P.O.:1200; I.V.:2580] Out: 4800 [Urine:4800]  GI: His wounds look good. There is no sign of any infection. He has minimal drainage. He does not have any rebound or guarding but does have some incisional tenderness  Lab Results:  CBC  Recent Labs  03/08/15 1532 03/09/15 0509  WBC 15.0* 8.7  HGB 15.3 14.3  HCT 46.0 42.2  PLT 241 208   CMP     Component Value Date/Time   NA 141 03/08/2015 1532   K 4.0 03/08/2015 1532   CL 102 03/08/2015 1532   CO2 32 03/08/2015 1532   GLUCOSE 104* 03/08/2015 1532   BUN 16 03/08/2015 1532   CREATININE 0.97 03/09/2015 0509   CALCIUM 10.1 03/08/2015 1532   PROT 8.4* 03/08/2015 1532   ALBUMIN 5.0 03/08/2015 1532   AST 22 03/08/2015 1532   ALT 22 03/08/2015 1532   ALKPHOS 58 03/08/2015 1532   BILITOT 1.0 03/08/2015 1532   GFRNONAA >60 03/09/2015 0509   GFRAA >60 03/09/2015 0509   PT/INR No results for input(s): LABPROT, INR in the last 72 hours.  Studies/Results: Ct Abdomen Pelvis W Contrast  03/08/2015   CLINICAL DATA:  Right lower quadrant pain for 2 days, elevated white blood cell count  EXAM: CT ABDOMEN AND PELVIS WITH CONTRAST  TECHNIQUE: Multidetector CT imaging of the abdomen and pelvis was performed using the standard protocol following bolus administration of intravenous contrast.  CONTRAST:  100mL OMNIPAQUE  IOHEXOL 300 MG/ML  SOLN  COMPARISON:  None.  FINDINGS: Lung bases are free of acute infiltrate or sizable effusion.  The liver, gallbladder, spleen, adrenal glands and pancreas are within normal limits. The kidneys are well visualized bilateral and reveal no renal calculi. Renal cystic changes noted in the upper pole on the right. The bladder is well distended. No pelvic mass lesion is seen. The appendix is mildly prominent with mild periappendiceal inflammatory change. Maximum diameter is approximately 11 mm. This is consistent with early appendicitis. No other focal abnormality is seen.  IMPRESSION: Changes consistent with early appendicitis.   Electronically Signed   By: Alcide CleverMark  Lukens M.D.   On: 03/08/2015 18:00    Assessment/Plan: Overall is doing relatively well but does not feel up to going home at this point. We will get him up out of bed more this morning see how he does ambulate check on him later in the day with the plan of discharging him today.

## 2015-03-10 NOTE — Progress Notes (Signed)
Patient was discharged and left the general surgery unit around 10pm.  Anabella went over discharge instructions and gave patient prescriptions and went over follow up appointments.  Family came to pick patient up.  Left hospital via wheelchair.   Arturo MortonClay, Charlene Cowdrey N  03/10/2015   11:44 PM

## 2015-03-10 NOTE — Discharge Instructions (Signed)
Do not drive on pain medications °Do not lift greater than 15 lbs for a period of 6 weeks °Call or return to ER if you develop fever greater than 101.5, nausea/vomiting, increased pain, redness/drainage from incisions °Take bandages off in 48 hours.  Okay to shower with bandages on or after they come off, no tub baths °

## 2015-03-11 LAB — SURGICAL PATHOLOGY

## 2015-03-16 NOTE — Discharge Summary (Signed)
Patient ID: Dustin CoveChristopher Montgomery MRN: [id number] DOB/AGE: 1991/06/13 24 y.o.  Admit date: 03/08/2015 Discharge date: 03/16/2015  Discharge Diagnoses:  Acute appendicitis  Procedures Performed: Laparoscopic appendectomy  Discharged Condition: good  Hospital Course: The patient underwent the above-mentioned procedure for the above-mentioned diagnosis, recovered and was discharged home.  Discharge Orders: Discharge Instructions    Diet - low sodium heart healthy    Complete by:  As directed      Driving Restrictions    Complete by:  As directed   No driving when taking pain meds     Increase activity slowly    Complete by:  As directed      Lifting restrictions    Complete by:  As directed   No heavy lifting     No wound care    Complete by:  As directed            Disposition: 01-Home or Self Care  Discharge Medications: No current facility-administered medications for this encounter.  Current outpatient prescriptions:  .  LORazepam (ATIVAN) 1 MG tablet, Take 1 tablet (1 mg total) by mouth 3 (three) times daily as needed for anxiety., Disp: 15 tablet, Rfl: 0 .  sertraline (ZOLOFT) 100 MG tablet, Take 100 mg by mouth every evening., Disp: , Rfl:  .  HYDROcodone-acetaminophen (NORCO/VICODIN) 5-325 MG per tablet, Take 1-2 tablets by mouth every 4 (four) hours as needed for moderate pain., Disp: 35 tablet, Rfl: 0  Follwup: Follow-up Information    Follow up with Claude MangesWilliam F Maday Guarino, MD In 1 week.   Specialty:  Surgery   Contact information:   [address] [place] [id number] [phone]       Signed: Claude MangesWilliam F Brando Taves 03/16/2015, 8:12 AM

## 2015-03-25 ENCOUNTER — Encounter: Payer: Self-pay | Admitting: Surgery

## 2015-04-01 ENCOUNTER — Ambulatory Visit (INDEPENDENT_AMBULATORY_CARE_PROVIDER_SITE_OTHER): Payer: BLUE CROSS/BLUE SHIELD | Admitting: Surgery

## 2015-04-01 ENCOUNTER — Encounter: Payer: Self-pay | Admitting: Surgery

## 2015-04-01 VITALS — BP 125/74 | HR 69 | Temp 97.3°F | Ht 69.0 in | Wt 212.0 lb

## 2015-04-01 DIAGNOSIS — K3589 Other acute appendicitis without perforation or gangrene: Secondary | ICD-10-CM

## 2015-04-01 NOTE — Patient Instructions (Signed)
When necessary follow-up.  Activity and diet as tolerated.

## 2015-04-01 NOTE — Progress Notes (Signed)
Subjective:     Patient ID: Dustin Montgomery, male   DOB: 18-Aug-1991, 24 y.o.   MRN: [id number]  HPI 24 year old white male status post laparoscopic appendectomy.  He is doing well. No fevers good appetite currently out of work.  Review of Systems    negative for fevers abdominal pain jaundice. Objective:   Physical Exam    his abdomen is soft and nontender incisions healed nicely. Assessment:     Stable status post laparoscopic appendectomy   Plan:     I reviewed the pathology today. There is consistent with acute appendicitis.

## 2016-11-04 IMAGING — CT CT ABD-PELV W/ CM
1 of 2 series · 15 of 32 positions shown, 19 images · IV contrast (omnipaque)
Comparison: None.

CLINICAL DATA: Right lower quadrant pain for 2 days, elevated white
blood cell count

EXAM:
CT ABDOMEN AND PELVIS WITH CONTRAST
TECHNIQUE: Multidetector CT imaging of the abdomen and pelvis was performed
using the standard protocol following bolus administration of
intravenous contrast.
CONTRAST:  100mL OMNIPAQUE IOHEXOL 300 MG/ML  SOLN

[Series 2: routine abd pel with · axial · 0.71mm/px · z∈[-524,-94]mm · 15 of 94 slices shown, 19 images]
[im 4/94  soft-tissue]
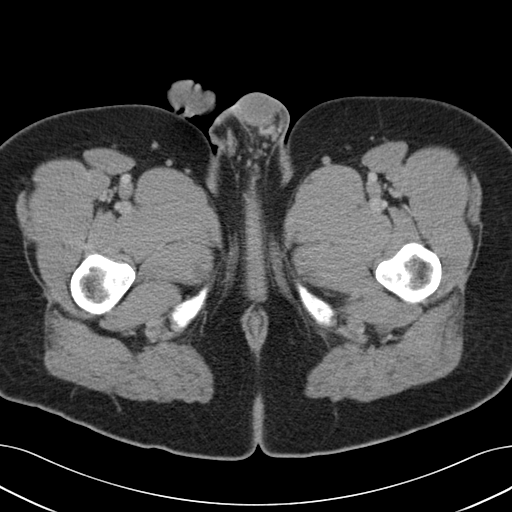
[im 4/94  bone]
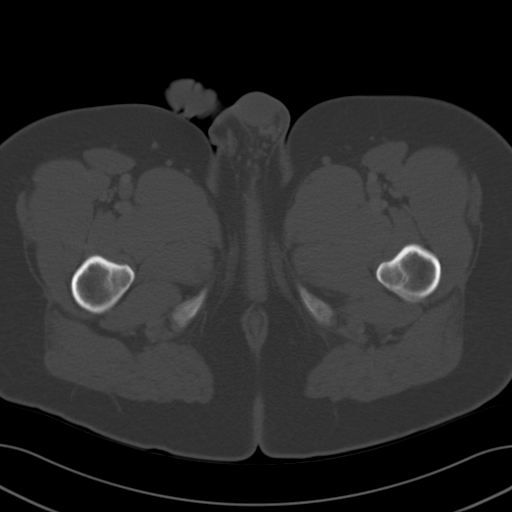
[im 12/94  soft-tissue]
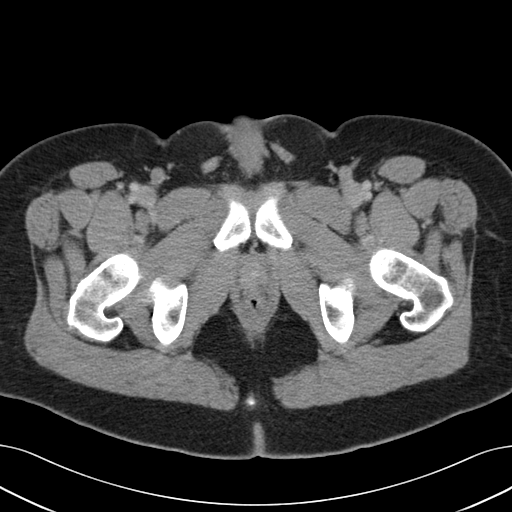
[im 20/94  soft-tissue]
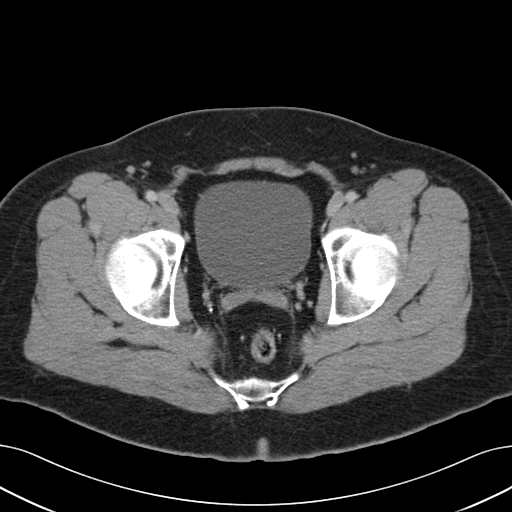
[im 28/94  soft-tissue]
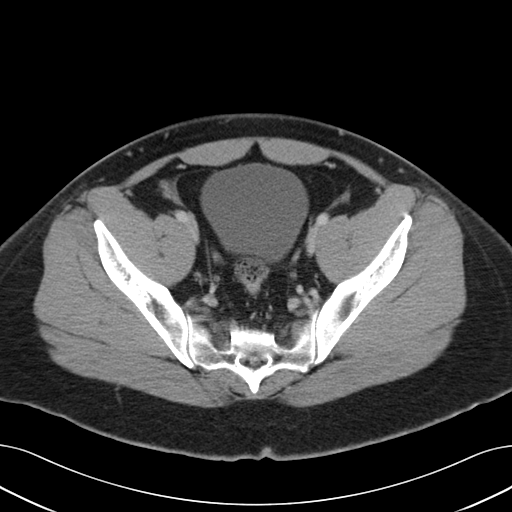
[im 32/94  soft-tissue]
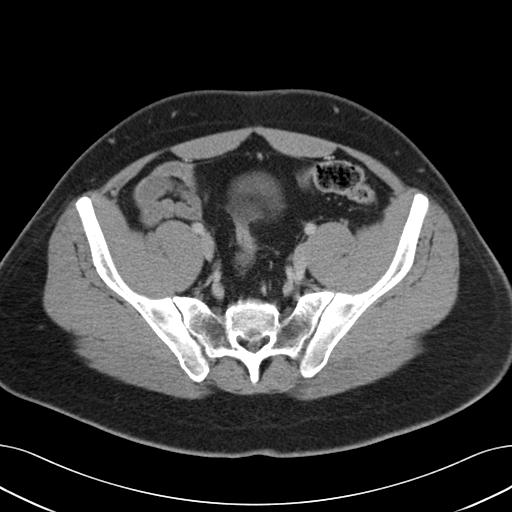
[im 39/94  soft-tissue]
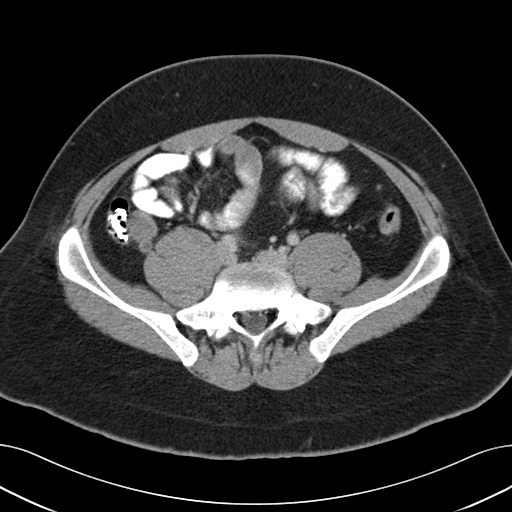
[im 47/94  soft-tissue]
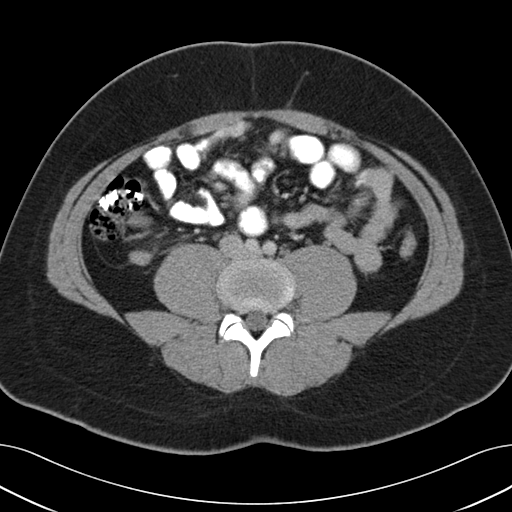
[im 55/94  soft-tissue]
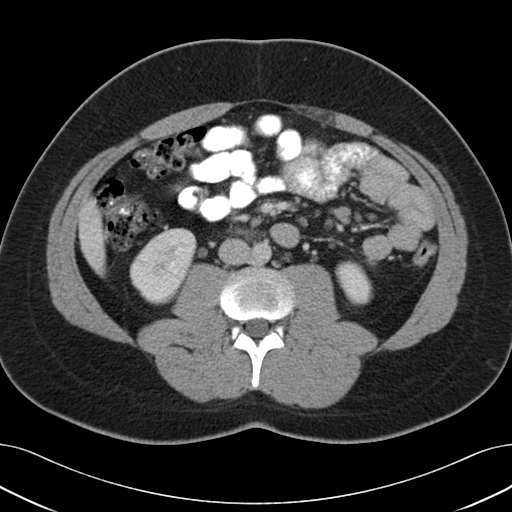
[im 63/94  soft-tissue]
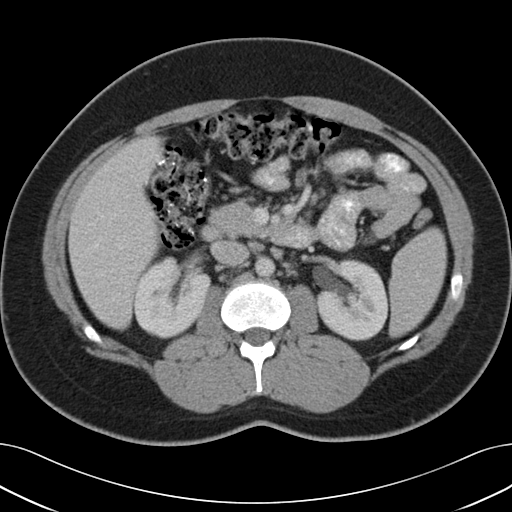
[im 63/94  bone]
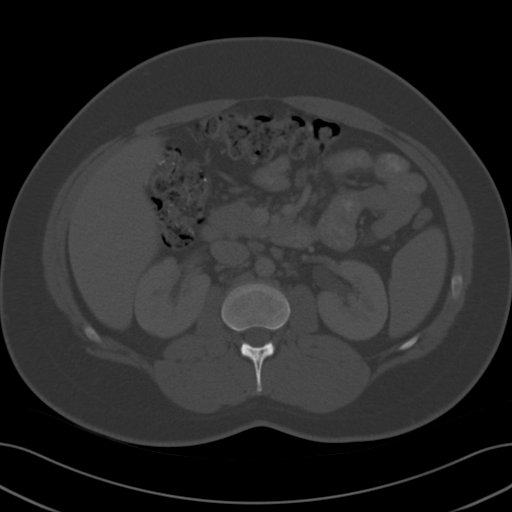
[im 66/94  soft-tissue]
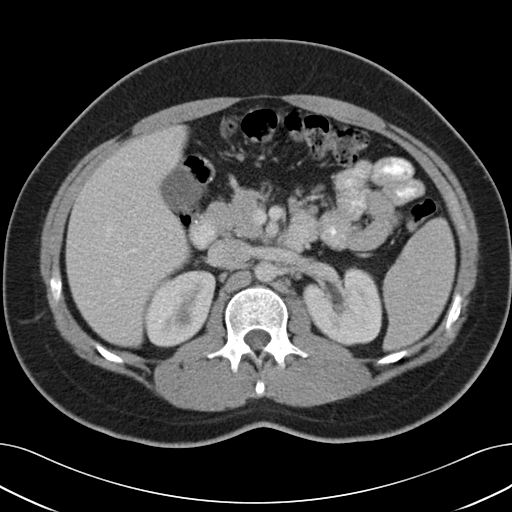
[im 74/94  soft-tissue]
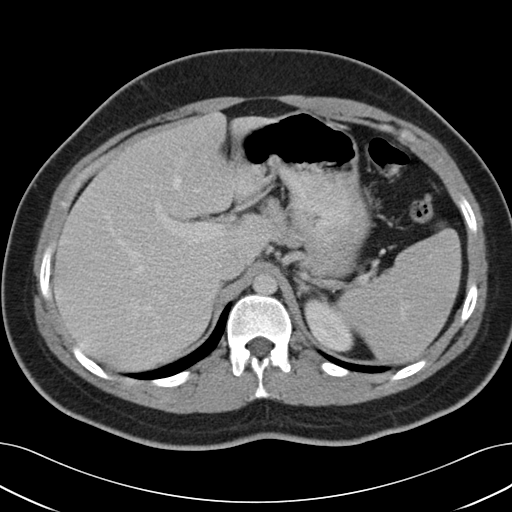
[im 78/94  lung]
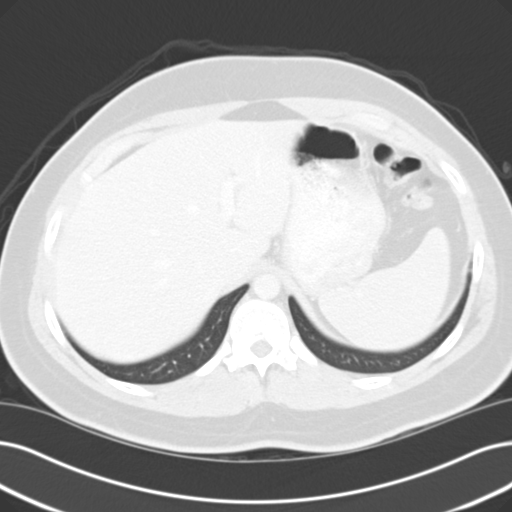
[im 82/94  soft-tissue]
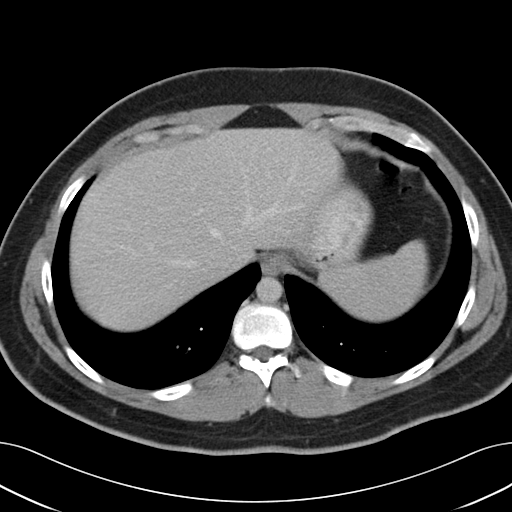
[im 82/94  lung]
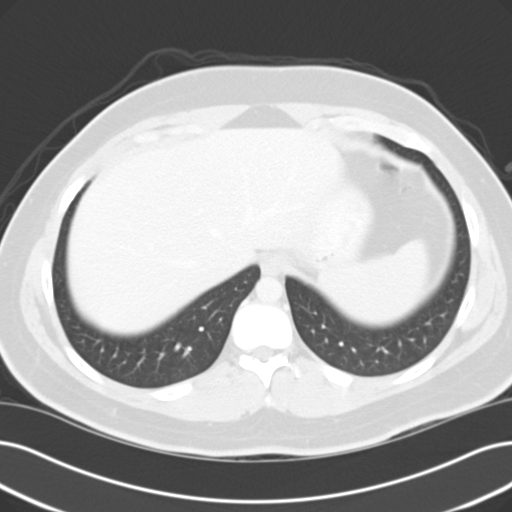
[im 86/94  lung]
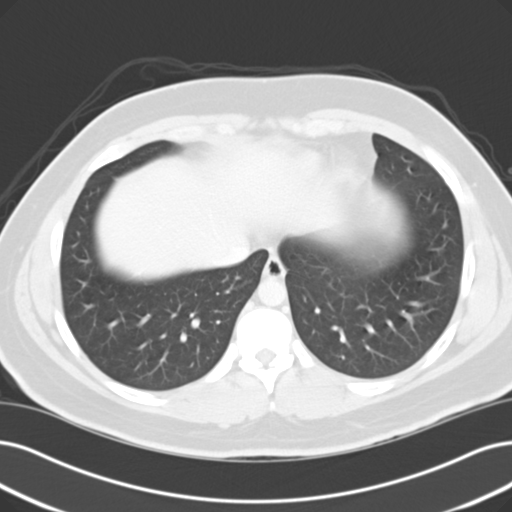
[im 90/94  soft-tissue]
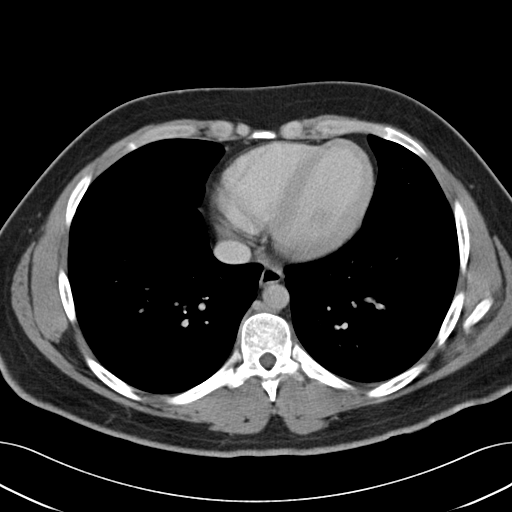
[im 90/94  lung]
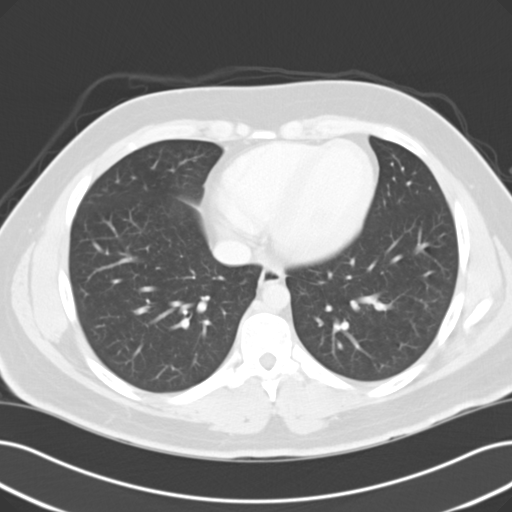

[15 of 32 positions shown; findings below may reference images not displayed]

FINDINGS: Lung bases are free of acute infiltrate or sizable effusion.

The liver, gallbladder, spleen, adrenal glands and pancreas are
within normal limits. The kidneys are well visualized bilateral and
reveal no renal calculi. Renal cystic changes noted in the upper
pole on the right. The bladder is well distended. No pelvic mass
lesion is seen. The appendix is mildly prominent with mild
periappendiceal inflammatory change. Maximum diameter is
approximately 11 mm. This is consistent with early appendicitis. No
other focal abnormality is seen.
IMPRESSION: Changes consistent with early appendicitis.

## 2022-12-15 ENCOUNTER — Encounter (INDEPENDENT_AMBULATORY_CARE_PROVIDER_SITE_OTHER): Payer: Self-pay | Admitting: Family Medicine

## 2022-12-15 ENCOUNTER — Ambulatory Visit (INDEPENDENT_AMBULATORY_CARE_PROVIDER_SITE_OTHER): Payer: Self-pay | Admitting: Family Medicine

## 2022-12-15 VITALS — BP 146/81 | HR 72 | Temp 97.9°F | Ht 68.0 in | Wt 253.0 lb

## 2022-12-15 DIAGNOSIS — Z0289 Encounter for other administrative examinations: Secondary | ICD-10-CM

## 2022-12-15 DIAGNOSIS — E65 Localized adiposity: Secondary | ICD-10-CM

## 2022-12-15 DIAGNOSIS — E669 Obesity, unspecified: Secondary | ICD-10-CM | POA: Insufficient documentation

## 2022-12-15 DIAGNOSIS — R632 Polyphagia: Secondary | ICD-10-CM

## 2022-12-15 DIAGNOSIS — R030 Elevated blood-pressure reading, without diagnosis of hypertension: Secondary | ICD-10-CM

## 2022-12-15 DIAGNOSIS — Z6838 Body mass index (BMI) 38.0-38.9, adult: Secondary | ICD-10-CM

## 2022-12-15 NOTE — Progress Notes (Signed)
Office: [phone]  /  Fax: [phone]   Initial Visit  Dustin Montgomery was seen in clinic today to evaluate for obesity. He is interested in losing weight to improve overall health and reduce the risk of weight related complications. He presents today to review program treatment options, initial physical assessment, and evaluation.     He was referred by: PCP  When asked what else they would like to accomplish? He states: Improve energy levels and physical activity, Improve existing medical conditions, and Improve quality of life  Weight history: overweight in childhood, thinned out in high school, gained weight over the past 4 years, about 50 lb  When asked how has your weight affected you? He states: Contributed to medical problems, Contributed to orthopedic problems or mobility issues, and Having fatigue  Some associated conditions: Hyperlipidemia  Contributing factors: Family history Standing a lot at work, a little walking, sometimes works nights  Weight promoting medications identified: None  Current nutrition plan: None  Current level of physical activity: NEAT; joined the gym last week (used to go the gym regularly)  Current or previous pharmacotherapy: None  Response to medication: Never tried medications   Past medical history includes:   Past Medical History:  Diagnosis Date   Anxiety    Depression      Objective:   BP (!) 146/81   Pulse 72   Temp 97.9 F (36.6 C)   Ht '5\' 8"'$  (1.727 m)   Wt 253 lb (114.8 kg)   SpO2 95%   BMI 38.47 kg/m  He was weighed on the bioimpedance scale: Body mass index is 38.47 kg/m.  Peak Weight:253 , Body Fat%:33.8, Visceral Fat Rating:16, Weight trend over the last 12 months: Increasing  General:  Alert, oriented and cooperative. Patient is in no acute distress.  Respiratory: Normal respiratory effort, no problems with respiration noted   Gait: able to ambulate independently  Mental Status: Normal mood and  affect. Normal behavior. Normal judgment and thought content.   DIAGNOSTIC DATA REVIEWED:  BMET    Component Value Date/Time   NA 141 03/08/2015 1532   K 4.0 03/08/2015 1532   CL 102 03/08/2015 1532   CO2 32 03/08/2015 1532   GLUCOSE 104 (H) 03/08/2015 1532   BUN 16 03/08/2015 1532   CREATININE 0.97 03/09/2015 0509   CALCIUM 10.1 03/08/2015 1532   GFRNONAA >60 03/09/2015 0509   GFRAA >60 03/09/2015 0509   No results found for: "HGBA1C" No results found for: "INSULIN" CBC    Component Value Date/Time   WBC 8.7 03/09/2015 0509   RBC 4.97 03/09/2015 0509   HGB 14.3 03/09/2015 0509   HCT 42.2 03/09/2015 0509   PLT 208 03/09/2015 0509   MCV 84.8 03/09/2015 0509   MCH 28.7 03/09/2015 0509   MCHC 33.9 03/09/2015 0509   RDW 12.5 03/09/2015 0509   Iron/TIBC/Ferritin/ %Sat No results found for: "IRON", "TIBC", "FERRITIN", "IRONPCTSAT" Lipid Panel  No results found for: "CHOL", "TRIG", "HDL", "CHOLHDL", "VLDL", "LDLCALC", "LDLDIRECT" Hepatic Function Panel     Component Value Date/Time   PROT 8.4 (H) 03/08/2015 1532   ALBUMIN 5.0 03/08/2015 1532   AST 22 03/08/2015 1532   ALT 22 03/08/2015 1532   ALKPHOS 58 03/08/2015 1532   BILITOT 1.0 03/08/2015 1532   No results found for: "TSH"   Assessment and Plan:   Central adiposity Assessment & Plan: Reviewed bioimpedance results showing a visceral fat rating elevated at 16.  We discussed the risk for fatty liver  disease, heart disease and type 2 diabetes as well as other metabolic consequences of visceral adiposity.  Track improvements in visceral fat rating with weight reduction   Generalized obesity Assessment & Plan: Reviewed program information and current bioimpedance results.  Set up visit for fasting labs, EKG and IC test to get started on an active plan for weight reduction.   BMI 38.0-38.9,adult  Elevated blood pressure reading in office without diagnosis of hypertension Assessment & Plan: Blood pressure is  elevated today at 146/81.  He denies recent use of cold medication, headache or chest pain. He is at risk for hypertension with a BMI of 38.  Recheck blood pressure next visit.  Consider treatment if elevated over 140 over 90 x 3   Polyphagia Assessment & Plan: Patient endorses signs of polyphasia with over consumption of food volumes at mealtime.  He has never used antiobesity medication.  Encouraged eating on a schedule lean protein and fiber with meals. Consider use of antiobesity medication         Obesity Treatment / Action Plan:  Patient will work on garnering support from family and friends to begin weight loss journey. Will work on eliminating or reducing the presence of highly palatable, calorie dense foods in the home. Will complete provided nutritional and psychosocial assessment questionnaire before the next appointment. Will be scheduled for indirect calorimetry to determine resting energy expenditure in a fasting state.  This will allow [place] to create a reduced calorie, high-protein meal plan to promote loss of fat mass while preserving muscle mass. Will reduce liquid calories and sugary drinks from diet. Was counseled on nutritional approaches to weight loss and benefits of complex carbs and high quality protein as part of nutritional weight management. Was counseled on pharmacotherapy and role as an adjunct in weight management.   Obesity Education Performed Today:  He was weighed on the bioimpedance scale and results were discussed and documented in the synopsis.  We discussed obesity as a disease and the importance of a more detailed evaluation of all the factors contributing to the disease.  We discussed the importance of long term lifestyle changes which include nutrition, exercise and behavioral modifications as well as the importance of customizing this to his specific health and social needs.  We discussed the benefits of reaching a healthier weight to  alleviate the symptoms of existing conditions and reduce the risks of the biomechanical, metabolic and psychological effects of obesity.  ANSUMANA GENS appears to be in the action stage of change and states they are ready to start intensive lifestyle modifications and behavioral modifications.  30 minutes was spent today on this visit including the above counseling, pre-visit chart review, and post-visit documentation.  Reviewed by clinician on day of visit: allergies, medications, problem list, medical history, surgical history, family history, social history, and previous encounter notes pertinent to obesity diagnosis.    Loyal Gambler, D.O. DABFM, DABOM [facility] [phone], [place] [address] [phone]

## 2022-12-15 NOTE — Assessment & Plan Note (Signed)
Blood pressure is elevated today at 146/81.  He denies recent use of cold medication, headache or chest pain. He is at risk for hypertension with a BMI of 38.  Recheck blood pressure next visit.  Consider treatment if elevated over 140 over 90 x 3

## 2022-12-15 NOTE — Assessment & Plan Note (Signed)
Reviewed program information and current bioimpedance results.  Set up visit for fasting labs, EKG and IC test to get started on an active plan for weight reduction.

## 2022-12-15 NOTE — Assessment & Plan Note (Signed)
Patient endorses signs of polyphasia with over consumption of food volumes at mealtime.  He has never used antiobesity medication.  Encouraged eating on a schedule lean protein and fiber with meals. Consider use of antiobesity medication

## 2022-12-15 NOTE — Assessment & Plan Note (Signed)
Reviewed bioimpedance results showing a visceral fat rating elevated at 16.  We discussed the risk for fatty liver disease, heart disease and type 2 diabetes as well as other metabolic consequences of visceral adiposity.  Track improvements in visceral fat rating with weight reduction

## 2023-01-25 ENCOUNTER — Encounter (INDEPENDENT_AMBULATORY_CARE_PROVIDER_SITE_OTHER): Payer: Self-pay | Admitting: Family Medicine

## 2023-01-25 ENCOUNTER — Ambulatory Visit (INDEPENDENT_AMBULATORY_CARE_PROVIDER_SITE_OTHER): Payer: BC Managed Care – PPO | Admitting: Family Medicine

## 2023-01-25 VITALS — BP 131/97 | HR 57 | Temp 98.2°F | Ht 68.0 in | Wt 257.0 lb

## 2023-01-25 DIAGNOSIS — E669 Obesity, unspecified: Secondary | ICD-10-CM | POA: Diagnosis not present

## 2023-01-25 DIAGNOSIS — F32A Depression, unspecified: Secondary | ICD-10-CM | POA: Insufficient documentation

## 2023-01-25 DIAGNOSIS — F3289 Other specified depressive episodes: Secondary | ICD-10-CM

## 2023-01-25 DIAGNOSIS — Z6839 Body mass index (BMI) 39.0-39.9, adult: Secondary | ICD-10-CM

## 2023-01-25 DIAGNOSIS — R948 Abnormal results of function studies of other organs and systems: Secondary | ICD-10-CM | POA: Diagnosis not present

## 2023-01-25 DIAGNOSIS — R0602 Shortness of breath: Secondary | ICD-10-CM

## 2023-01-25 DIAGNOSIS — F419 Anxiety disorder, unspecified: Secondary | ICD-10-CM | POA: Insufficient documentation

## 2023-01-25 DIAGNOSIS — Z1331 Encounter for screening for depression: Secondary | ICD-10-CM | POA: Insufficient documentation

## 2023-01-25 DIAGNOSIS — R5383 Other fatigue: Secondary | ICD-10-CM | POA: Diagnosis not present

## 2023-01-26 LAB — COMPREHENSIVE METABOLIC PANEL
ALT: 23 IU/L (ref 0–44)
AST: 20 IU/L (ref 0–40)
Albumin/Globulin Ratio: 2.1 (ref 1.2–2.2)
Albumin: 4.8 g/dL (ref 4.1–5.1)
Alkaline Phosphatase: 63 IU/L (ref 44–121)
BUN/Creatinine Ratio: 13 (ref 9–20)
Bilirubin Total: 1.2 mg/dL (ref 0.0–1.2)
CO2: 24 mmol/L (ref 20–29)
Calcium: 9.6 mg/dL (ref 8.7–10.2)
Chloride: 101 mmol/L (ref 96–106)
Creatinine, Ser: 0.92 mg/dL (ref 0.76–1.27)
Globulin, Total: 2.3 g/dL (ref 1.5–4.5)
Glucose: 82 mg/dL (ref 70–99)
Potassium: 4.3 mmol/L (ref 3.5–5.2)
eGFR: 114 mL/min/{1.73_m2} (ref >59)

## 2023-01-26 LAB — T4, FREE: Free T4: 0.92 ng/dL (ref 0.82–1.77)

## 2023-01-26 LAB — CBC WITH DIFFERENTIAL/PLATELET
Basophils Absolute: 0 10*3/uL (ref 0.0–0.2)
Basos: 1 %
Eos: 2 %
Hematocrit: 46.9 % (ref 37.5–51.0)
Hemoglobin: 15.7 g/dL (ref 13.0–17.7)
Immature Grans (Abs): 0 10*3/uL (ref 0.0–0.1)
Lymphocytes Absolute: 2.2 10*3/uL (ref 0.7–3.1)
Lymphs: 38 %
MCHC: 33.5 g/dL (ref 31.5–35.7)
RDW: 12.4 % (ref 11.6–15.4)

## 2023-01-26 LAB — TSH: TSH: 1.09 u[IU]/mL (ref 0.450–4.500)

## 2023-01-26 LAB — HEMOGLOBIN A1C
Est. average glucose Bld gHb Est-mCnc: 108 mg/dL
Hgb A1c MFr Bld: 5.4 % (ref 4.8–5.6)

## 2023-01-26 LAB — VITAMIN D 25 HYDROXY (VIT D DEFICIENCY, FRACTURES): Vit D, 25-Hydroxy: 5.8 ng/mL — ABNORMAL LOW (ref 30.0–100.0)

## 2023-01-26 LAB — LIPID PANEL: VLDL Cholesterol Cal: 52 mg/dL — ABNORMAL HIGH (ref 5–40)

## 2023-01-26 LAB — INSULIN, RANDOM

## 2023-01-26 NOTE — Progress Notes (Signed)
Chief Complaint:   OBESITY ELIZARDO CHILSON (MR# [id number]) is a 32 y.o. male who presents for evaluation and treatment of obesity and related comorbidities. Current BMI is Body mass index is 39.08 kg/m. Dustin Montgomery has been struggling with his weight for many years and has been unsuccessful in either losing weight, maintaining weight loss, or reaching his healthy weight goal.  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  He is married to Zalma.  He averages 3500 steps per day.  He has a positive family history of obesity and diabetes.  He started to gain weight 5 years ago.  He is an Surveyor, quantity, he eats out a lot and snacks on junk food.  Siaosi is currently in the action stage of change and ready to dedicate time achieving and maintaining a healthier weight. Quindarrius is interested in becoming our patient and working on intensive lifestyle modifications including (but not limited to) diet and exercise for weight loss.  Esmeralda's habits were reviewed today and are as follows: His family eats meals together, he thinks his family will eat healthier with him, he struggles with family and or coworkers weight loss sabotage, his desired weight loss is 57 lbs, he has been heavy most of his life, he started gaining weight in 2019, his heaviest weight ever was 261 pounds, he is a picky eater and doesn't like to eat healthier foods, he has significant food cravings issues, he snacks frequently in the evenings, he skips meals frequently, he is frequently drinking liquids with calories, he frequently makes poor food choices, he frequently eats larger portions than normal, and he struggles with emotional eating.  Depression Screen Sladen's Food and Mood (modified PHQ-9) score was 21.  Subjective:   1. Other fatigue Farah admits to daytime somnolence and admits to waking up still tired. Patient has a history of symptoms of daytime fatigue and morning  fatigue. Jarvin generally gets 7 hours of sleep per night, and states that he has nightime awakenings and generally restful sleep. Snoring is present. Apneic episodes are not present. Epworth Sleepiness Score is 4.  EKG, WNL.  2. SOBOE (shortness of breath on exertion) Cristal Deer notes increasing shortness of breath with exercising and seems to be worsening over time with weight gain. He notes getting out of breath sooner with activity than he used to. This has not gotten worse recently. Itamar denies shortness of breath at rest or orthopnea.  3. Low basal metabolic rate Metabolic rate is 829 cal/day, lower than expected.  This may be due to untreated OSA, lack of protein intake, and/or lack of exercise.   4. Anxiety and depression Patient is on sertraline 100 mg daily and Ativan 1 mg as needed.  Stress levels are high.  5. Other depression with emotional eating Bariatric PHQ-9:21.  Patient reports a good support system.  Assessment/Plan:   1. Other fatigue Khristopher does feel that his weight is causing his energy to be lower than it should be. Fatigue may be related to obesity, depression or many other causes. Labs will be ordered, and in the meanwhile, Alf will focus on self care including making healthy food choices, increasing physical activity and focusing on stress reduction.  Update labs today.  - EKG 12-Lead - VITAMIN D 25 Hydroxy (Vit-D Deficiency, Fractures) - TSH - T4, free - T3 - Lipid panel - Insulin, random - Hemoglobin A1c - Folate - Comprehensive metabolic panel - Vitamin B12 - CBC with Differential/Platelet - Testosterone  2.  SOBOE (shortness of breath on exertion) Ladarion does feel that he gets out of breath more easily that he used to when he exercises. Garvin's shortness of breath appears to be obesity related and exercise induced. He has agreed to work on weight loss and gradually increase exercise to treat his exercise induced  shortness of breath. Will continue to monitor closely.  3. Low basal metabolic rate Plans adding in resistance training 2 to 3 days/week, increase walking time, begin meal plan and consider a sleep study.  4. Anxiety and depression Focus on stress reduction, proper sleep at night and more consistent outdoor walking.  Continue current medications.  5. Other depression with emotional eating Consider CBT and/or use of bupropion.  Keep junk food snacks out of the house.  6. Depression screen Avedis had a positive depression screening. Depression is commonly associated with obesity and often results in emotional eating behaviors. We will monitor this closely and work on CBT to help improve the non-hunger eating patterns. Referral to Psychology may be required if no improvement is seen as he continues in our clinic.  7. Generalized obesity  8. Obesity,current BMI 39.2 Drake is currently in the action stage of change and his goal is to continue with weight loss efforts. I recommend Denny begin the structured treatment plan as follows:  He has agreed to the Category 3 Plan+ 200 snack calories.  Exercise goals:  Track daily steps.     Behavioral modification strategies: increasing lean protein intake, increasing vegetables, increasing water intake, increasing high fiber foods, decreasing eating out, no skipping meals, meal planning and cooking strategies, keeping healthy foods in the home, better snacking choices, and planning for success.  He was informed of the importance of frequent follow-up visits to maximize his success with intensive lifestyle modifications for his multiple health conditions. He was informed we would discuss his lab results at his next visit unless there is a critical issue that needs to be addressed sooner. Wylan agreed to keep his next visit at the agreed upon time to discuss these results.  Objective:   Blood pressure (!) 131/97, pulse (!) 57,  temperature 98.2 F (36.8 C), height  (1.727 m), weight 257 lb (116.6 kg), SpO2 98 %. Body mass index is 39.08 kg/m.  EKG: Normal sinus rhythm, rate 57 bpm.  Indirect Calorimeter completed today shows a VO2 of 286 and a REE of 1973.  His calculated basal metabolic rate is 8119 thus his basal metabolic rate is worse than expected.  General: Cooperative, alert, well developed, in no acute distress. HEENT: Conjunctivae and lids unremarkable. Cardiovascular: Regular rhythm.  Lungs: Normal work of breathing. Neurologic: No focal deficits.   Lab Results  Component Value Date   CREATININE 0.92 01/25/2023   BUN 12 01/25/2023   NA 142 01/25/2023   K 4.3 01/25/2023   CL 101 01/25/2023   CO2 24 01/25/2023   Lab Results  Component Value Date   ALT 23 01/25/2023   AST 20 01/25/2023   ALKPHOS 63 01/25/2023   BILITOT 1.2 01/25/2023   Lab Results  Component Value Date   HGBA1C 5.4 01/25/2023   Lab Results  Component Value Date   INSULIN WILL FOLLOW 01/25/2023   Lab Results  Component Value Date   TSH 1.090 01/25/2023   Lab Results  Component Value Date   CHOL 234 (H) 01/25/2023   HDL 35 (L) 01/25/2023   LDLCALC 147 (H) 01/25/2023   TRIG 282 (H) 01/25/2023   CHOLHDL 6.7 (  H) 01/25/2023   Lab Results  Component Value Date   WBC 5.8 01/25/2023   HGB 15.7 01/25/2023   HCT 46.9 01/25/2023   MCV 87 01/25/2023   PLT 260 01/25/2023   No results found for: "IRON", "TIBC", "FERRITIN"  Attestation Statements:   Reviewed by clinician on day of visit: allergies, medications, problem list, medical history, surgical history, family history, social history, and previous encounter notes.  I have personally spent 40 minutes total time today in preparation, patient care, nutritional counseling and documentation for this visit, including the following: review of clinical lab tests; review of medical tests/procedures/services.   I, Malcolm Metro, am acting as Energy manager for  Seymour Bars, DO.  I have reviewed the above documentation for accuracy and completeness, and I agree with the above. -Seymour Bars, DO

## 2023-01-27 LAB — LIPID PANEL
Chol/HDL Ratio: 6.7 ratio — ABNORMAL HIGH (ref 0.0–5.0)
Cholesterol, Total: 234 mg/dL — ABNORMAL HIGH (ref 100–199)
HDL: 35 mg/dL — ABNORMAL LOW (ref >39)
LDL Chol Calc (NIH): 147 mg/dL — ABNORMAL HIGH (ref 0–99)
Triglycerides: 282 mg/dL — ABNORMAL HIGH (ref 0–149)

## 2023-01-27 LAB — CBC WITH DIFFERENTIAL/PLATELET
EOS (ABSOLUTE): 0.1 10*3/uL (ref 0.0–0.4)
Immature Granulocytes: 0 %
MCH: 29 pg (ref 26.6–33.0)
MCV: 87 fL (ref 79–97)
Monocytes Absolute: 0.5 10*3/uL (ref 0.1–0.9)
Monocytes: 8 %
Neutrophils Absolute: 3 10*3/uL (ref 1.4–7.0)
Neutrophils: 51 %
Platelets: 260 10*3/uL (ref 150–450)
RBC: 5.42 x10E6/uL (ref 4.14–5.80)
WBC: 5.8 10*3/uL (ref 3.4–10.8)

## 2023-01-27 LAB — FOLATE: Folate: 4.4 ng/mL (ref >3.0)

## 2023-01-27 LAB — COMPREHENSIVE METABOLIC PANEL
BUN: 12 mg/dL (ref 6–20)
Sodium: 142 mmol/L (ref 134–144)
Total Protein: 7.1 g/dL (ref 6.0–8.5)

## 2023-01-27 LAB — VITAMIN B12: Vitamin B-12: 110 pg/mL — ABNORMAL LOW (ref 232–1245)

## 2023-01-27 LAB — T3: T3, Total: 123 ng/dL (ref 71–180)

## 2023-01-27 LAB — TESTOSTERONE: Testosterone: 289 ng/dL (ref 264–916)

## 2023-02-08 ENCOUNTER — Encounter (INDEPENDENT_AMBULATORY_CARE_PROVIDER_SITE_OTHER): Payer: Self-pay | Admitting: Family Medicine

## 2023-02-08 ENCOUNTER — Ambulatory Visit (INDEPENDENT_AMBULATORY_CARE_PROVIDER_SITE_OTHER): Payer: BC Managed Care – PPO | Admitting: Family Medicine

## 2023-02-08 VITALS — BP 122/84 | HR 64 | Temp 98.1°F | Ht 68.0 in | Wt 250.0 lb

## 2023-02-08 DIAGNOSIS — E88819 Insulin resistance, unspecified: Secondary | ICD-10-CM | POA: Insufficient documentation

## 2023-02-08 DIAGNOSIS — Z6838 Body mass index (BMI) 38.0-38.9, adult: Secondary | ICD-10-CM

## 2023-02-08 DIAGNOSIS — G4730 Sleep apnea, unspecified: Secondary | ICD-10-CM

## 2023-02-08 DIAGNOSIS — E559 Vitamin D deficiency, unspecified: Secondary | ICD-10-CM

## 2023-02-08 DIAGNOSIS — E538 Deficiency of other specified B group vitamins: Secondary | ICD-10-CM

## 2023-02-08 DIAGNOSIS — E785 Hyperlipidemia, unspecified: Secondary | ICD-10-CM

## 2023-02-08 MED ORDER — VITAMIN D (ERGOCALCIFEROL) 1.25 MG (50000 UNIT) PO CAPS: 50000.0000 [IU] | ORAL_CAPSULE | ORAL | 0 refills | Status: DC

## 2023-02-08 MED ORDER — VITAMIN B-12 1000 MCG PO TABS: 1000.0000 ug | ORAL_TABLET | Freq: Every day | ORAL | 0 refills | Status: DC

## 2023-02-08 NOTE — Progress Notes (Signed)
Office: [phone]  /  Fax: [phone]  WEIGHT SUMMARY AND BIOMETRICS  Starting Date: 01/25/23  Starting Weight: 257lb   Weight Lost Since Last Visit: 7lb   Vitals Temp: 98.1 F (36.7 C) BP: 122/84 Pulse Rate: 64 SpO2: 97 %   Body Composition  Body Fat %: 33 % Fat Mass (lbs): 82.6 lbs Muscle Mass (lbs): 159.4 lbs Total Body Water (lbs): 118.8 lbs Visceral Fat Rating : 16    HPI  Chief Complaint: OBESITY  Clearance is here to discuss his progress with his obesity treatment plan. He is on the the Category 3 Plan and states he is following his eating plan approximately 15 % of the time. He states he is exercising 40 minutes 5-7 times per week.   Interval History:  Since last office visit he is down 7 lb He experience the death of one of his friends He is eating greek yogurt with granola in the morning.   He had some meals out and is cooking more at home and making better choices He cut back on fried foods and soda He has added in some walking and is sleeping better He plans to increase his water intake   Pharmacotherapy: none  PHYSICAL EXAM:  Blood pressure 122/84, pulse 64, temperature 98.1 F (36.7 C), height 5\' 8"  (1.727 m), weight 250 lb (113.4 kg), SpO2 97 %. Body mass index is 38.01 kg/m.  General: He is overweight, cooperative, alert, well developed, and in no acute distress. PSYCH: Has normal mood, affect and thought process.   Lungs: Normal breathing effort, no conversational dyspnea.   ASSESSMENT AND PLAN  TREATMENT PLAN FOR OBESITY:  Recommended Dietary Goals  Tigran is currently in the action stage of change. As such, his goal is to continue weight management plan. He has agreed to the Category 3 Plan.  Behavioral Intervention  We discussed the following Behavioral Modification Strategies today: increasing lean protein intake, decreasing simple carbohydrates , increasing vegetables, increasing lower glycemic fruits,  increasing water intake, work on meal planning and preparation, reading food labels , work on managing stress, creating time for self-care and relaxation measures, continue to practice mindfulness when eating, and planning for success.  Additional resources provided today: NA  Recommended Physical Activity Goals  Alin has been advised to work up to 150 minutes of moderate intensity aerobic activity a week and strengthening exercises 2-3 times per week for cardiovascular health, weight loss maintenance and preservation of muscle mass.   He has agreed to Start aerobic activity with a goal of 150 minutes a week at moderate intensity.   Pharmacotherapy changes for the treatment of obesity: none  ASSOCIATED CONDITIONS ADDRESSED TODAY  Vitamin D deficiency Assessment & Plan: Last vitamin D Lab Results  Component Value Date   VD25OH 5.8 (L) 01/25/2023   Reviewed lab from last visit.  Vitamin D very low and we discussed how this affects leptin resistance, immune function, bone health and energy levels. This is a NEW problem and he has never taken a vitamin D supplement.  Begin RX vitamin D 50,000 IU 2 x a week Recheck level in 3 mos  Orders: -     Vitamin D (Ergocalciferol); Take 1 capsule (50,000 Units total) by mouth 2 (two) times a week.  Dispense: 10 capsule; Refill: 0  Sleep-disordered breathing -     Ambulatory referral to Neurology  Morbid obesity (HCC)  B12 deficiency Assessment & Plan: New problem Reviewed labs from last visit B12 level 110 and he  has c/o fatigue and paresthesias that come and go in his extremities and his face. His Folic acid and CBC are normal.  He denies a fam hx of pernicious anemia.  He denies a vegan diet but eats little red meat.  Begin Vitamin B12 RX 1,000 mcg daily.  Recheck level in 3 mos.  Orders: -     Vitamin B-12; Take 1 tablet (1,000 mcg total) by mouth daily.  Dispense: 30 tablet; Refill: 0  Dyslipidemia (high LDL; low  HDL) Assessment & Plan: Lab Results  Component Value Date   CHOL 234 (H) 01/25/2023   HDL 35 (L) 01/25/2023   LDLCALC 147 (H) 01/25/2023   TRIG 282 (H) 01/25/2023   CHOLHDL 6.7 (H) 01/25/2023   Reviewed labs from last visit.  His LDL, Tchol and TG are high and HDL is low.  He has recently cut back on fried foods and sweets. He is unsure of fam hx  Continue a low saturated fat/ low sugar diet as prescribed Repeat FLP in 3 mos   BMI 38.0-38.9,adult  Insulin resistance Assessment & Plan: Reviewed lab from last visit Fasting insulin was high at 23.3 with a normal A1c He has signs of hyperinsulinemia including hyperphagia. We discussed how hyperinsulinemia is a precursor to prediabetes and type II diabetes.  He has started to work on reducing intake of starches and sweets. He has increased his walking time Continue to work on these healthy lifestyle changes.  Consider use of metformin Recheck levels in 3 mos       He was informed of the importance of frequent follow up visits to maximize his success with intensive lifestyle modifications for his multiple health conditions.   ATTESTASTION STATEMENTS:  Reviewed by clinician on day of visit: allergies, medications, problem list, medical history, surgical history, family history, social history, and previous encounter notes pertinent to obesity diagnosis.   I have personally spent 30 minutes total time today in preparation, patient care, nutritional counseling and documentation for this visit, including the following: review of clinical lab tests; review of medical tests/procedures/services.      Glennis Brink, DO DABFM, DABOM [facility] [address] [place], [place] [address] [phone]

## 2023-02-08 NOTE — Assessment & Plan Note (Signed)
Lab Results  Component Value Date   CHOL 234 (H) 01/25/2023   HDL 35 (L) 01/25/2023   LDLCALC 147 (H) 01/25/2023   TRIG 282 (H) 01/25/2023   CHOLHDL 6.7 (H) 01/25/2023   Reviewed labs from last visit.  His LDL, Tchol and TG are high and HDL is low.  He has recently cut back on fried foods and sweets. He is unsure of fam hx  Continue a low saturated fat/ low sugar diet as prescribed Repeat FLP in 3 mos

## 2023-02-08 NOTE — Assessment & Plan Note (Signed)
Reviewed lab from last visit Fasting insulin was high at 23.3 with a normal A1c He has signs of hyperinsulinemia including hyperphagia. We discussed how hyperinsulinemia is a precursor to prediabetes and type II diabetes.  He has started to work on reducing intake of starches and sweets. He has increased his walking time Continue to work on these healthy lifestyle changes.  Consider use of metformin Recheck levels in 3 mos

## 2023-02-08 NOTE — Assessment & Plan Note (Signed)
New problem Reviewed labs from last visit B12 level 110 and he has c/o fatigue and paresthesias that come and go in his extremities and his face. His Folic acid and CBC are normal.  He denies a fam hx of pernicious anemia.  He denies a vegan diet but eats little red meat.  Begin Vitamin B12 RX 1,000 mcg daily.  Recheck level in 3 mos.

## 2023-02-08 NOTE — Assessment & Plan Note (Signed)
Last vitamin D Lab Results  Component Value Date   VD25OH 5.8 (L) 01/25/2023   Reviewed lab from last visit.  Vitamin D very low and we discussed how this affects leptin resistance, immune function, bone health and energy levels. This is a NEW problem and he has never taken a vitamin D supplement.  Begin RX vitamin D 50,000 IU 2 x a week Recheck level in 3 mos

## 2023-02-10 ENCOUNTER — Encounter (INDEPENDENT_AMBULATORY_CARE_PROVIDER_SITE_OTHER): Payer: Self-pay

## 2023-02-27 ENCOUNTER — Ambulatory Visit (INDEPENDENT_AMBULATORY_CARE_PROVIDER_SITE_OTHER): Payer: BC Managed Care – PPO | Admitting: Family Medicine

## 2023-02-27 ENCOUNTER — Encounter (INDEPENDENT_AMBULATORY_CARE_PROVIDER_SITE_OTHER): Payer: Self-pay | Admitting: Family Medicine

## 2023-02-27 VITALS — BP 117/73 | HR 67 | Temp 98.7°F | Ht 68.0 in | Wt 255.0 lb

## 2023-02-27 DIAGNOSIS — E785 Hyperlipidemia, unspecified: Secondary | ICD-10-CM

## 2023-02-27 DIAGNOSIS — G4730 Sleep apnea, unspecified: Secondary | ICD-10-CM | POA: Diagnosis not present

## 2023-02-27 DIAGNOSIS — E559 Vitamin D deficiency, unspecified: Secondary | ICD-10-CM

## 2023-02-27 DIAGNOSIS — Z6838 Body mass index (BMI) 38.0-38.9, adult: Secondary | ICD-10-CM

## 2023-02-27 DIAGNOSIS — E538 Deficiency of other specified B group vitamins: Secondary | ICD-10-CM

## 2023-02-27 MED ORDER — VITAMIN B-12 1000 MCG PO TABS
1000.0000 ug | ORAL_TABLET | Freq: Every day | ORAL | 0 refills | Status: AC
Start: 2023-02-27 — End: ?

## 2023-02-27 MED ORDER — VITAMIN D (ERGOCALCIFEROL) 1.25 MG (50000 UNIT) PO CAPS
50000.0000 [IU] | ORAL_CAPSULE | ORAL | 0 refills | Status: AC
Start: 2023-02-27 — End: ?

## 2023-02-27 NOTE — Progress Notes (Signed)
Office: [phone]  /  Fax: [phone]  WEIGHT SUMMARY AND BIOMETRICS  Starting Date: 01/25/23  Starting Weight: 257lb   Weight Lost Since Last Visit: 0lb   Vitals Temp: 98.7 F (37.1 C) BP: 117/73 Pulse Rate: 67 SpO2: 97 %   Body Composition  Body Fat %: 33.7 % Fat Mass (lbs): 86.2 lbs Muscle Mass (lbs): 161 lbs Total Body Water (lbs): 119 lbs Visceral Fat Rating : 16     HPI  Chief Complaint: OBESITY  Dustin Montgomery is here to discuss his progress with his obesity treatment plan. He is on the the Category 3 Plan and states he is following his eating plan approximately 60-70 % of the time. He states he is exercising 60 minutes 7 times per week.   Interval History:  Since last office visit he is up 5 lb He is up 1.6 pounds of muscle mass and up 3.6 pounds of body fat This gives him a net weight loss of 2 lb in the past month He is struggling to stay on track at dinner with a lot of social meals out He is trying to make good choices He is walking outdoors and tracking his steps-- he is up to 6,000 steps/ day (up from 1500 steps/ day) He is enjoying outdoor walks He is a habit eater at night  Pharmacotherapy: None  PHYSICAL EXAM:  Blood pressure 117/73, pulse 67, temperature 98.7 F (37.1 C), height 5\' 8"  (1.727 m), weight 255 lb (115.7 kg), SpO2 97 %. Body mass index is 38.77 kg/m.  General: He is overweight, cooperative, alert, well developed, and in no acute distress. PSYCH: Has normal mood, affect and thought process.   Lungs: Normal breathing effort, no conversational dyspnea.   ASSESSMENT AND PLAN  TREATMENT PLAN FOR OBESITY:  Recommended Dietary Goals  Dustin Montgomery is currently in the action stage of change. As such, his goal is to continue weight management plan. He has agreed to the Category 3 Plan.  Behavioral Intervention  We discussed the following Behavioral Modification Strategies today: increasing lean protein intake, decreasing  simple carbohydrates , increasing vegetables, increasing lower glycemic fruits, increasing fiber rich foods, avoiding skipping meals, increasing water intake, work on meal planning and preparation, keeping healthy foods at home, continue to practice mindfulness when eating, and planning for success.  Additional resources provided today: NA  Recommended Physical Activity Goals  Dustin Montgomery has been advised to work up to 150 minutes of moderate intensity aerobic activity a week and strengthening exercises 2-3 times per week for cardiovascular health, weight loss maintenance and preservation of muscle mass.   He has agreed to Increase the intensity, frequency or duration of aerobic exercises   Set a step goal for 7000 or more steps daily, using a smart watch to track  Pharmacotherapy changes for the treatment of obesity: None--he declined use of metformin for insulin resistance  ASSOCIATED CONDITIONS ADDRESSED TODAY  B12 deficiency Assessment & Plan: Paresthesias have improved.  Energy level is slowly improving.  He is doing well on vitamin B12 1000 mcg once daily.  His last B12 level was low at 110.  Plan to recheck B12 level in the next 2 months  If not improving, will change him over to B12 injections  Orders: -     Vitamin B-12; Take 1 tablet (1,000 mcg total) by mouth daily.  Dispense: 30 tablet; Refill: 0  Vitamin D deficiency Assessment & Plan: Last vitamin D Lab Results  Component Value Date   VD25OH 5.8 (L)  01/25/2023   He is doing well on prescription vitamin D 50,000 IU twice weekly.  His energy level is starting to improve.  He denies adverse side effects.  Recheck vitamin D level in the next 2 months  Orders: -     Vitamin D (Ergocalciferol); Take 1 capsule (50,000 Units total) by mouth 2 (two) times a week.  Dispense: 10 capsule; Refill: 0  Morbid obesity (HCC) with starting BMI 39  BMI 38.0-38.9,adult  Dyslipidemia (high LDL; low HDL) Assessment & Plan: Lab  Results  Component Value Date   CHOL 234 (H) 01/25/2023   HDL 35 (L) 01/25/2023   LDLCALC 147 (H) 01/25/2023   TRIG 282 (H) 01/25/2023   CHOLHDL 6.7 (H) 01/25/2023   He is working on a low saturated fat/low trans fat diet, reducing his intake of added sugar and refined carbohydrates.  He is currently not on any lipid-lowering medications.  Continue current practices for diet and exercise changes.  Plan to recheck fasting lipid panel in the next 2 to 3 months.   Sleep-disordered breathing Assessment & Plan: A referral was placed to [facility] neurology for polysomnogram but he has yet to schedule.  His referral remains open.  He was provided the phone number to call for an appointment.  He does complain of daytime somnolence.  His last testosterone level was low at 289.  This may be due to untreated OSA.         He was informed of the importance of frequent follow up visits to maximize his success with intensive lifestyle modifications for his multiple health conditions.   ATTESTASTION STATEMENTS:  Reviewed by clinician on day of visit: allergies, medications, problem list, medical history, surgical history, family history, social history, and previous encounter notes pertinent to obesity diagnosis.   I have personally spent 30 minutes total time today in preparation, patient care, nutritional counseling and documentation for this visit, including the following: review of clinical lab tests; review of medical tests/procedures/services.      Glennis Brink, DO DABFM, DABOM [facility] [address] [place], [place] [address] [phone]

## 2023-02-27 NOTE — Assessment & Plan Note (Signed)
Paresthesias have improved.  Energy level is slowly improving.  He is doing well on vitamin B12 1000 mcg once daily.  His last B12 level was low at 110.  Plan to recheck B12 level in the next 2 months  If not improving, will change him over to B12 injections

## 2023-02-27 NOTE — Assessment & Plan Note (Signed)
Lab Results  Component Value Date   CHOL 234 (H) 01/25/2023   HDL 35 (L) 01/25/2023   LDLCALC 147 (H) 01/25/2023   TRIG 282 (H) 01/25/2023   CHOLHDL 6.7 (H) 01/25/2023   He is working on a low saturated fat/low trans fat diet, reducing his intake of added sugar and refined carbohydrates.  He is currently not on any lipid-lowering medications.  Continue current practices for diet and exercise changes.  Plan to recheck fasting lipid panel in the next 2 to 3 months.

## 2023-02-27 NOTE — Assessment & Plan Note (Signed)
A referral was placed to [facility] neurology for polysomnogram but he has yet to schedule.  His referral remains open.  He was provided the phone number to call for an appointment.  He does complain of daytime somnolence.  His last testosterone level was low at 289.  This may be due to untreated OSA.

## 2023-02-27 NOTE — Assessment & Plan Note (Signed)
Last vitamin D Lab Results  Component Value Date   VD25OH 5.8 (L) 01/25/2023   He is doing well on prescription vitamin D 50,000 IU twice weekly.  His energy level is starting to improve.  He denies adverse side effects.  Recheck vitamin D level in the next 2 months

## 2023-03-27 ENCOUNTER — Ambulatory Visit (INDEPENDENT_AMBULATORY_CARE_PROVIDER_SITE_OTHER): Payer: BC Managed Care – PPO | Admitting: Family Medicine
